# Patient Record
Sex: Female | Born: 1960
Health system: Southern US, Community
[De-identification: ages and names within clinical notes are randomized; demographics above are authoritative.]

## PROBLEM LIST (undated history)

## (undated) DIAGNOSIS — G4700 Insomnia, unspecified: Secondary | ICD-10-CM

## (undated) DIAGNOSIS — R112 Nausea with vomiting, unspecified: Secondary | ICD-10-CM

## (undated) DIAGNOSIS — M1990 Unspecified osteoarthritis, unspecified site: Secondary | ICD-10-CM

## (undated) DIAGNOSIS — R51 Headache: Secondary | ICD-10-CM

## (undated) DIAGNOSIS — R519 Headache, unspecified: Secondary | ICD-10-CM

## (undated) DIAGNOSIS — Z9889 Other specified postprocedural states: Secondary | ICD-10-CM

## (undated) DIAGNOSIS — C4491 Basal cell carcinoma of skin, unspecified: Secondary | ICD-10-CM

## (undated) DIAGNOSIS — D649 Anemia, unspecified: Secondary | ICD-10-CM

## (undated) DIAGNOSIS — S92911A Unspecified fracture of right toe(s), initial encounter for closed fracture: Secondary | ICD-10-CM

## (undated) DIAGNOSIS — B019 Varicella without complication: Secondary | ICD-10-CM

## (undated) DIAGNOSIS — N390 Urinary tract infection, site not specified: Secondary | ICD-10-CM

## (undated) HISTORY — DX: Unspecified fracture of right toe(s), initial encounter for closed fracture: S92.911A

## (undated) HISTORY — DX: Unspecified osteoarthritis, unspecified site: M19.90

## (undated) HISTORY — DX: Varicella without complication: B01.9

## (undated) HISTORY — DX: Headache, unspecified: R51.9

## (undated) HISTORY — DX: Urinary tract infection, site not specified: N39.0

## (undated) HISTORY — DX: Basal cell carcinoma of skin, unspecified: C44.91

## (undated) HISTORY — DX: Anemia, unspecified: D64.9

## (undated) HISTORY — DX: Nausea with vomiting, unspecified: R11.2

## (undated) HISTORY — DX: Insomnia, unspecified: G47.00

## (undated) HISTORY — PX: LAPAROSCOPIC ABDOMINAL EXPLORATION: SHX6249

## (undated) HISTORY — DX: Other specified postprocedural states: Z98.890

## (undated) HISTORY — DX: Nausea with vomiting, unspecified: Z98.890

## (undated) HISTORY — DX: Headache: R51

---

## 1998-01-01 ENCOUNTER — Inpatient Hospital Stay (HOSPITAL_COMMUNITY): Admission: AD | Admit: 1998-01-01 | Discharge: 1998-01-04 | Payer: Self-pay | Admitting: *Deleted

## 2000-06-16 ENCOUNTER — Other Ambulatory Visit: Admission: RE | Admit: 2000-06-16 | Discharge: 2000-06-16 | Payer: Self-pay | Admitting: *Deleted

## 2002-10-22 ENCOUNTER — Other Ambulatory Visit: Admission: RE | Admit: 2002-10-22 | Discharge: 2002-10-22 | Payer: Self-pay | Admitting: *Deleted

## 2003-10-19 HISTORY — PX: APPENDECTOMY: SHX54

## 2004-05-20 ENCOUNTER — Other Ambulatory Visit: Admission: RE | Admit: 2004-05-20 | Discharge: 2004-05-20 | Payer: Self-pay | Admitting: Obstetrics and Gynecology

## 2004-05-28 ENCOUNTER — Inpatient Hospital Stay (HOSPITAL_COMMUNITY): Admission: AD | Admit: 2004-05-28 | Discharge: 2004-05-30 | Payer: Self-pay | Admitting: Obstetrics and Gynecology

## 2005-11-08 ENCOUNTER — Other Ambulatory Visit: Admission: RE | Admit: 2005-11-08 | Discharge: 2005-11-08 | Payer: Self-pay | Admitting: Obstetrics and Gynecology

## 2010-10-18 DIAGNOSIS — S92911A Unspecified fracture of right toe(s), initial encounter for closed fracture: Secondary | ICD-10-CM

## 2010-10-18 HISTORY — DX: Unspecified fracture of right toe(s), initial encounter for closed fracture: S92.911A

## 2011-07-20 ENCOUNTER — Emergency Department (HOSPITAL_BASED_OUTPATIENT_CLINIC_OR_DEPARTMENT_OTHER)
Admission: EM | Admit: 2011-07-20 | Discharge: 2011-07-20 | Disposition: A | Discharge status: Home / Self Care | Payer: BC Managed Care – PPO | Attending: Emergency Medicine | Admitting: Emergency Medicine

## 2011-07-20 ENCOUNTER — Emergency Department (INDEPENDENT_AMBULATORY_CARE_PROVIDER_SITE_OTHER): Payer: BC Managed Care – PPO

## 2011-07-20 ENCOUNTER — Encounter: Payer: Self-pay | Admitting: *Deleted

## 2011-07-20 DIAGNOSIS — S92919A Unspecified fracture of unspecified toe(s), initial encounter for closed fracture: Secondary | ICD-10-CM

## 2011-07-20 DIAGNOSIS — T148XXA Other injury of unspecified body region, initial encounter: Secondary | ICD-10-CM

## 2011-07-20 DIAGNOSIS — W19XXXA Unspecified fall, initial encounter: Secondary | ICD-10-CM

## 2011-07-20 DIAGNOSIS — W07XXXA Fall from chair, initial encounter: Secondary | ICD-10-CM | POA: Insufficient documentation

## 2011-07-20 MED ORDER — OXYCODONE-ACETAMINOPHEN 5-325 MG PO TABS: 2.0000 | ORAL_TABLET | ORAL | Status: AC | PRN

## 2011-07-20 NOTE — ED Provider Notes (Signed)
History     CSN: [id number] Arrival date & time: 07/20/2011  4:30 PM  Chief Complaint  Patient presents with  . Toe Injury    (Consider location/radiation/quality/duration/timing/severity/associated sxs/prior treatment) HPI Comments: Pt state her right great toe is hurting:pt states that she has bruising on left ankle, but it is okay  Patient is a 50 y.o. female presenting with fall. The history is provided by the patient. No language interpreter was used.  Fall The accident occurred less than 1 hour ago. The fall occurred from a stool. She fell from a height of 3 to 5 ft. She landed on a hard floor. There was no blood loss. Point of impact: right foot. Pain location: right foot. The pain is moderate. She was ambulatory at the scene. There was no entrapment after the fall. There was no drug use involved in the accident. There was no alcohol use involved in the accident. Pertinent negatives include no vomiting, no headaches and no loss of consciousness. The symptoms are aggravated by activity.    History reviewed. No pertinent past medical history.  Past Surgical History  Procedure Date  . Appendectomy   . Cesarean section     No family history on file.  History  Substance Use Topics  . Smoking status: Never Smoker   . Smokeless tobacco: Not on file  . Alcohol Use: Yes    OB History    Grav Para Term Preterm Abortions TAB SAB Ect Mult Living                  Review of Systems  Gastrointestinal: Negative for vomiting.  Neurological: Negative for loss of consciousness and headaches.  All other systems reviewed and are negative.    Allergies  Review of patient's allergies indicates not on file.  Home Medications  No current outpatient prescriptions on file.  BP 126/62  Pulse 78  Temp(Src) 97.9 F (36.6 C) (Oral)  Resp 20  SpO2 100%  Physical Exam  Nursing note and vitals reviewed. Constitutional: She is oriented to person, place, and time. She appears  well-developed and well-nourished.  HENT:  Head: Normocephalic.  Eyes: Pupils are equal, round, and reactive to light.  Cardiovascular: Normal rate and regular rhythm.   Pulmonary/Chest: Effort normal and breath sounds normal.  Musculoskeletal:       Pt has large amount of swelling to the proximal phalanx of the right great toe  Neurological: She is alert and oriented to person, place, and time.  Skin:       Pt has abrasion to the left lower leg and bruising to the left ankle  Psychiatric: She has a normal mood and affect.    ED Course  Procedures (including critical care time)  Labs Reviewed - No data to display Dg Toe Great Right  07/20/2011  *RADIOLOGY REPORT*  Clinical Data: Right great toe pain.  Fall.  RIGHT TOE - 2+ VIEW  Comparison: None.  Findings: Comminuted fracture noted through the proximal phalanx of the right great toe with mildly displaced fracture fragments. Fracture enters the IP joint.  Mild soft tissue swelling.  No additional acute bony abnormality.  IMPRESSION: Comminuted intra-articular fracture through the proximal phalanx of the right great toe.  Original Report Authenticated By: Cyndie Chime, M.D.     No diagnosis found.    MDM  Pt splinted for comfort by staff:pt is okay to follow up with ortho        Teressa Lower, NP 07/20/11 1705

## 2011-07-20 NOTE — ED Notes (Signed)
Right great toe inj. Larey Seat off a chair. Bruising and swelling noted.

## 2011-07-20 NOTE — ED Notes (Signed)
I fit patient to post op shoe per NP order. I then fit crutches (med) and gave instructions. Patient demonstrated use correctly.

## 2011-07-20 NOTE — ED Notes (Signed)
Pt refused wheelchair, opted to ambulate out of ER on crutches.

## 2011-07-23 NOTE — ED Provider Notes (Signed)
Medical screening examination/treatment/procedure(s) were performed by non-physician practitioner and as supervising physician I was immediately available for consultation/collaboration.  Farouk Vivero, MD 07/23/11 1623 

## 2012-05-08 ENCOUNTER — Other Ambulatory Visit: Payer: Self-pay | Admitting: Obstetrics and Gynecology

## 2012-12-04 ENCOUNTER — Other Ambulatory Visit: Payer: Self-pay | Admitting: Dermatology

## 2013-06-06 ENCOUNTER — Other Ambulatory Visit: Payer: Self-pay | Admitting: Obstetrics and Gynecology

## 2014-05-01 ENCOUNTER — Other Ambulatory Visit: Payer: Self-pay | Admitting: Dermatology

## 2014-07-16 ENCOUNTER — Other Ambulatory Visit: Payer: Self-pay | Admitting: Obstetrics and Gynecology

## 2014-07-18 LAB — CYTOLOGY - PAP

## 2014-10-24 ENCOUNTER — Other Ambulatory Visit: Payer: Self-pay | Admitting: Dermatology

## 2016-05-20 ENCOUNTER — Encounter: Payer: Self-pay | Admitting: Family Medicine

## 2016-05-25 ENCOUNTER — Ambulatory Visit (INDEPENDENT_AMBULATORY_CARE_PROVIDER_SITE_OTHER): Payer: BLUE CROSS/BLUE SHIELD | Admitting: Family Medicine

## 2016-05-25 ENCOUNTER — Encounter: Payer: Self-pay | Admitting: Family Medicine

## 2016-05-25 VITALS — BP 135/82 | HR 81 | Temp 98.0°F | Resp 20 | Ht 66.0 in | Wt 170.5 lb

## 2016-05-25 DIAGNOSIS — M2550 Pain in unspecified joint: Secondary | ICD-10-CM | POA: Diagnosis not present

## 2016-05-25 DIAGNOSIS — Z7189 Other specified counseling: Secondary | ICD-10-CM

## 2016-05-25 DIAGNOSIS — Z6827 Body mass index (BMI) 27.0-27.9, adult: Secondary | ICD-10-CM

## 2016-05-25 DIAGNOSIS — W57XXXA Bitten or stung by nonvenomous insect and other nonvenomous arthropods, initial encounter: Secondary | ICD-10-CM

## 2016-05-25 DIAGNOSIS — Z803 Family history of malignant neoplasm of breast: Secondary | ICD-10-CM | POA: Diagnosis not present

## 2016-05-25 DIAGNOSIS — T148 Other injury of unspecified body region: Secondary | ICD-10-CM

## 2016-05-25 DIAGNOSIS — Z0001 Encounter for general adult medical examination with abnormal findings: Secondary | ICD-10-CM | POA: Insufficient documentation

## 2016-05-25 DIAGNOSIS — E663 Overweight: Secondary | ICD-10-CM | POA: Insufficient documentation

## 2016-05-25 DIAGNOSIS — Z1329 Encounter for screening for other suspected endocrine disorder: Secondary | ICD-10-CM

## 2016-05-25 DIAGNOSIS — Z136 Encounter for screening for cardiovascular disorders: Secondary | ICD-10-CM

## 2016-05-25 DIAGNOSIS — R7309 Other abnormal glucose: Secondary | ICD-10-CM | POA: Insufficient documentation

## 2016-05-25 DIAGNOSIS — Z7689 Persons encountering health services in other specified circumstances: Secondary | ICD-10-CM

## 2016-05-25 HISTORY — DX: Bitten or stung by nonvenomous insect and other nonvenomous arthropods, initial encounter: W57.XXXA

## 2016-05-25 NOTE — Patient Instructions (Signed)
It was a pleasure to meet you today.  Please schedule fasting labs at the 8-10 week mark after your tick bite.  We will test all screening and arthralgia labs.  We will cover physical, lab results on the provider appt to follow about 3 days after labs drawn.

## 2016-05-25 NOTE — Addendum Note (Signed)
Addended by: Howard Pouch A on: 05/25/2016 08:05 PM   Modules accepted: Orders

## 2016-05-25 NOTE — Progress Notes (Signed)
Patient ID: Michelle Ochoa, female  DOB: 1961-07-16, 55 y.o.   MRN: [id number] Patient Care Team    Relationship Specialty Notifications Start End  Ma Hillock, DO PCP - General Family Medicine  05/20/16   Dian Queen, MD Consulting Physician Obstetrics and Gynecology  05/20/16     Subjective:  Michelle Ochoa is a 55 y.o.  female present for new patient establishment With a list of complaints she would like to discuss. Patient was advised this is a new patient establishment appointment and we can cover 1 acute complaint with her establishment. She will need to follow-up on all of her other chronic issues at a separate appointment. All past medical history, surgical history, allergies, family history, immunizations, medications and social history were Obtained and entered in the electronic medical record today. All recent labs, ED visits and hospitalizations within the last year were reviewed. All records are requested/reviewed.  Tick bite: Patient reports he tick bite that occurred about 6 weeks ago, behind right knee. Since that time she feels that Her arthralgias are "amped up". She reports to take as being engorged, and she states she received the prophylactic one-time dose of doxycycline. She denies headache, fever, chills, rash. She took tylenol  last night t because of increased pain. She states typically she does not have to take medications for her arthralgia/arthritis.  Health maintenance:  Colonoscopy: Never completed, fhx polyps. Overdue. Mammogram: completed:08/2015, birads normal. Family history in her mother at age 78. Would advise yearly screening. Patient follows with gynecology. Cervical cancer screening: last pap: 08/2015, Dr.Grewal gynecologist. Immunizations: tdap unknown, Influenza unknown (encouraged yearly) Infectious disease screening: unknown. DEXA: normal, 2007.  Labs 02/12/2015: Labs for tsh, T3, T4, T4 free were all normal. Hgba1c 5.7   There is no  immunization history on file for this patient.   Past Medical History:  Diagnosis Date  . Arthritis   . Basal cell carcinoma   . Chicken pox   . Headache   . Insomnia   . Toe fracture, right 2012   rt great toe  . UTI (lower urinary tract infection)    Allergies  Allergen Reactions  . Codeine Nausea Only  . Vicodin [Hydrocodone-Acetaminophen] Nausea Only   Past Surgical History:  Procedure Laterality Date  . APPENDECTOMY  2005  . CESAREAN SECTION  [phone]   Family History  Problem Relation Age of Onset  . Breast cancer Mother     72  . Dementia Mother   . Prostate cancer Father   . Diabetes Paternal Uncle   . Heart disease Paternal Uncle   . Early death Paternal Uncle   . Depression Maternal Grandmother   . Diabetes Maternal Grandfather   . Diabetes Paternal Grandfather    Social History   Social History  . Marital status: Married    Spouse name: N/A  . Number of children: N/A  . Years of education: N/A   Occupational History  . Not on file.   Social History Main Topics  . Smoking status: Never Smoker  . Smokeless tobacco: Never Used  . Alcohol use 4.8 oz/week    8 Glasses of wine per week  . Drug use: No  . Sexual activity: Yes    Partners: Male     Comment: Married; husband has vasectomy.   Other Topics Concern  . Not on file   Social History Narrative   Married, [facility] 3 adult children [name], [place], [place]).   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    Takes daily vitamin   Wears seatbelt   exercises routinely.    Smoke detector in the home.    Feels safe in her relationships.      Medication List       Accurate as of 05/25/16  7:41 PM. Always use your most recent med list.          diphenhydrAMINE 25 mg capsule Commonly known as:  BENADRYL Take 25 mg by mouth at bedtime as needed.        No results found for this or any previous visit (from the past 2160 hour(s)).   ROS: 14 pt review  of systems performed and negative (unless mentioned in an HPI)  Objective: BP 135/82 (BP Location: Right Arm, Patient Position: Sitting, Cuff Size: Normal)   Pulse 81   Temp 98 F (36.7 C)   Resp 20   Ht _0  (1.676 m)   Wt 170 lb 8 oz (77.3 kg)   SpO2 98%   BMI 27.52 kg/m  Gen: Afebrile. No acute distress. Nontoxic in appearance, well-developed, well-nourished,  pleasant Caucasian female. HENT: AT. [place].  MMM.  Eyes:Pupils Equal Round Reactive to light, Extraocular movements intact,  Conjunctiva without redness, discharge or icterus. Neck/lymp/endocrine: Supple, no lymphadenopathy CV: RRR, no edema Chest: CTAB, no wheeze, rhonchi or crackles.  Abd: Soft. NTND. BS present. Skin:  Warm and well-perfused.  Neuro/Msk:Normal gait. PERLA. EOMi. Alert. Oriented x3.  No obvious muscle skeletal deformity or joint erythema. Psych: Normal affect, dress and demeanor. Normal speech. Normal thought content and judgment.  Assessment/plan: Michelle Ochoa is a 55 y.o. female present for establishment of care with complaints of recent tick bite and increased arthralgias. Arthralgia/tick bite - Start workup for arthralgias, rule out tickborne illness, inflammatory causes. - CBC w/Diff; Future - Comp Met (CMET); Future - TSH; Future - Sed Rate (ESR); Future - C-reactive protein; Future - Rheumatoid Factor; Future - Vitamin D (25 hydroxy); Future - B12; Future - Antinuclear Antib (ANA); Future  Family history of breast cancer Would advise yearly screening with her family history, repeat indicated at the end of this year.  Elevated hemoglobin A1c/BMI 27 - Patient will be counseled on diet and exercise as appropriate if repeat labs elevated. - Hemoglobin A1c; Future  Thyroid disorder screen - TSH; Future  Encounter for screening for cardiovascular disorders - Lipid panel; Future  Future labs Place for physical which is to occur within the next 4 weeks. Discussed with patient making a lab  appointment early so that CAN be drawn, and results will be able to be discussed during her visit.   Return in about 2 weeks (around 06/08/2016) for CPE.  Greater than 45 minutes was spent with patient, greater than 50% of that time was spent face-to-face with patient counseling and coordinating care.  Electronically signed by: Howard Pouch, DO [place]

## 2016-06-04 ENCOUNTER — Other Ambulatory Visit (INDEPENDENT_AMBULATORY_CARE_PROVIDER_SITE_OTHER): Payer: BLUE CROSS/BLUE SHIELD

## 2016-06-04 DIAGNOSIS — M2550 Pain in unspecified joint: Secondary | ICD-10-CM | POA: Diagnosis not present

## 2016-06-04 DIAGNOSIS — W57XXXA Bitten or stung by nonvenomous insect and other nonvenomous arthropods, initial encounter: Secondary | ICD-10-CM | POA: Diagnosis not present

## 2016-06-04 DIAGNOSIS — Z136 Encounter for screening for cardiovascular disorders: Secondary | ICD-10-CM | POA: Diagnosis not present

## 2016-06-04 DIAGNOSIS — T148 Other injury of unspecified body region: Secondary | ICD-10-CM | POA: Diagnosis not present

## 2016-06-04 DIAGNOSIS — R7309 Other abnormal glucose: Secondary | ICD-10-CM | POA: Diagnosis not present

## 2016-06-04 DIAGNOSIS — Z1329 Encounter for screening for other suspected endocrine disorder: Secondary | ICD-10-CM | POA: Diagnosis not present

## 2016-06-04 LAB — COMPREHENSIVE METABOLIC PANEL
ALBUMIN: 4.1 g/dL (ref 3.5–5.2)
ALK PHOS: 69 U/L (ref 39–117)
ALT: 29 U/L (ref 0–35)
AST: 27 U/L (ref 0–37)
BILIRUBIN TOTAL: 0.6 mg/dL (ref 0.2–1.2)
BUN: 8 mg/dL (ref 6–23)
CALCIUM: 9.2 mg/dL (ref 8.4–10.5)
CO2: 30 mEq/L (ref 19–32)
CREATININE: 0.79 mg/dL (ref 0.40–1.20)
Chloride: 105 mEq/L (ref 96–112)
GFR: 80.97 mL/min (ref >60.00)
Glucose, Bld: 95 mg/dL (ref 70–99)
Potassium: 4.4 mEq/L (ref 3.5–5.1)
SODIUM: 140 meq/L (ref 135–145)
TOTAL PROTEIN: 6.6 g/dL (ref 6.0–8.3)

## 2016-06-04 LAB — LIPID PANEL
CHOL/HDL RATIO: 4
Cholesterol: 212 mg/dL — ABNORMAL HIGH (ref 0–200)
HDL: 51.9 mg/dL (ref >39.00)
LDL CALC: 131 mg/dL — AB (ref 0–99)
NonHDL: 159.78
TRIGLYCERIDES: 146 mg/dL (ref 0.0–149.0)
VLDL: 29.2 mg/dL (ref 0.0–40.0)

## 2016-06-04 LAB — CBC WITH DIFFERENTIAL/PLATELET
Basophils Absolute: 0 10*3/uL (ref 0.0–0.1)
Basophils Relative: 0.6 % (ref 0.0–3.0)
EOS ABS: 0.1 10*3/uL (ref 0.0–0.7)
EOS PCT: 2.2 % (ref 0.0–5.0)
HEMATOCRIT: 38.8 % (ref 36.0–46.0)
HEMOGLOBIN: 13.3 g/dL (ref 12.0–15.0)
LYMPHS PCT: 37.3 % (ref 12.0–46.0)
Lymphs Abs: 1.7 10*3/uL (ref 0.7–4.0)
MCHC: 34.2 g/dL (ref 30.0–36.0)
MCV: 87.9 fl (ref 78.0–100.0)
MONO ABS: 0.4 10*3/uL (ref 0.1–1.0)
Monocytes Relative: 7.9 % (ref 3.0–12.0)
Neutro Abs: 2.4 10*3/uL (ref 1.4–7.7)
Neutrophils Relative %: 52 % (ref 43.0–77.0)
Platelets: 189 10*3/uL (ref 150.0–400.0)
RBC: 4.42 Mil/uL (ref 3.87–5.11)
RDW: 13.6 % (ref 11.5–15.5)
WBC: 4.6 10*3/uL (ref 4.0–10.5)

## 2016-06-04 LAB — VITAMIN B12: Vitamin B-12: 387 pg/mL (ref 211–911)

## 2016-06-04 LAB — VITAMIN D 25 HYDROXY (VIT D DEFICIENCY, FRACTURES): VITD: 19.54 ng/mL — ABNORMAL LOW (ref 30.00–100.00)

## 2016-06-04 LAB — C-REACTIVE PROTEIN: CRP: 0.1 mg/dL — ABNORMAL LOW (ref 0.5–20.0)

## 2016-06-04 LAB — SEDIMENTATION RATE: Sed Rate: 18 mm/hr (ref 0–30)

## 2016-06-04 LAB — HEMOGLOBIN A1C: HEMOGLOBIN A1C: 5.4 % (ref 4.6–6.5)

## 2016-06-04 LAB — RHEUMATOID FACTOR

## 2016-06-04 LAB — TSH: TSH: 2.11 u[IU]/mL (ref 0.35–4.50)

## 2016-06-07 LAB — ANA: ANA: POSITIVE — AB

## 2016-06-07 LAB — ANTI-NUCLEAR AB-TITER (ANA TITER): ANA Titer 1: 1:80 {titer} — ABNORMAL HIGH

## 2016-06-08 ENCOUNTER — Encounter: Payer: Self-pay | Admitting: Family Medicine

## 2016-06-08 ENCOUNTER — Ambulatory Visit (INDEPENDENT_AMBULATORY_CARE_PROVIDER_SITE_OTHER): Payer: BLUE CROSS/BLUE SHIELD | Admitting: Family Medicine

## 2016-06-08 VITALS — BP 119/79 | HR 65 | Temp 98.3°F | Resp 20 | Ht 66.0 in | Wt 171.5 lb

## 2016-06-08 DIAGNOSIS — Z6827 Body mass index (BMI) 27.0-27.9, adult: Secondary | ICD-10-CM | POA: Diagnosis not present

## 2016-06-08 DIAGNOSIS — M2550 Pain in unspecified joint: Secondary | ICD-10-CM

## 2016-06-08 DIAGNOSIS — Z0001 Encounter for general adult medical examination with abnormal findings: Secondary | ICD-10-CM | POA: Diagnosis not present

## 2016-06-08 DIAGNOSIS — E559 Vitamin D deficiency, unspecified: Secondary | ICD-10-CM

## 2016-06-08 DIAGNOSIS — Z1211 Encounter for screening for malignant neoplasm of colon: Secondary | ICD-10-CM

## 2016-06-08 DIAGNOSIS — Z23 Encounter for immunization: Secondary | ICD-10-CM | POA: Diagnosis not present

## 2016-06-08 MED ORDER — VITAMIN D (ERGOCALCIFEROL) 1.25 MG (50000 UNIT) PO CAPS: 50000.0000 [IU] | ORAL_CAPSULE | ORAL | 0 refills | Status: DC

## 2016-06-08 NOTE — Patient Instructions (Addendum)
Health Maintenance, Female Adopting a healthy lifestyle and getting preventive care can go a long way to promote health and wellness. Talk with your health care provider about what schedule of regular examinations is right for you. This is a good chance for you to check in with your provider about disease prevention and staying healthy. In between checkups, there are plenty of things you can do on your own. Experts have done a lot of research about which lifestyle changes and preventive measures are most likely to keep you healthy. Ask your health care provider for more information. WEIGHT AND DIET  Eat a healthy diet  Be sure to include plenty of vegetables, fruits, low-fat dairy products, and lean protein.  Do not eat a lot of foods high in solid fats, added sugars, or salt.  Get regular exercise. This is one of the most important things you can do for your health.  Most adults should exercise for at least 150 minutes each week. The exercise should increase your heart rate and make you sweat (moderate-intensity exercise).  Most adults should also do strengthening exercises at least twice a week. This is in addition to the moderate-intensity exercise.  Maintain a healthy weight  Body mass index (BMI) is a measurement that can be used to identify possible weight problems. It estimates body fat based on height and weight. Your health care provider can help determine your BMI and help you achieve or maintain a healthy weight.  For females 20 years of age and older:   A BMI below 18.5 is considered underweight.  A BMI of 18.5 to 24.9 is normal.  A BMI of 25 to 29.9 is considered overweight.  A BMI of 30 and above is considered obese.  Watch levels of cholesterol and blood lipids  You should start having your blood tested for lipids and cholesterol at 53 years of age, then have this test every 5 years.  You may need to have your cholesterol levels checked more often if:  Your lipid  or cholesterol levels are high.  You are older than 53 years of age.  You are at high risk for heart disease.  CANCER SCREENING   Lung Cancer  Lung cancer screening is recommended for adults 55-80 years old who are at high risk for lung cancer because of a history of smoking.  A yearly low-dose CT scan of the lungs is recommended for people who:  Currently smoke.  Have quit within the past 15 years.  Have at least a 30-pack-year history of smoking. A pack year is smoking an average of one pack of cigarettes a day for 1 year.  Yearly screening should continue until it has been 15 years since you quit.  Yearly screening should stop if you develop a health problem that would prevent you from having lung cancer treatment.  Breast Cancer  Practice breast self-awareness. This means understanding how your breasts normally appear and feel.  It also means doing regular breast self-exams. Let your health care provider know about any changes, no matter how small.  If you are in your 20s or 30s, you should have a clinical breast exam (CBE) by a health care provider every 1-3 years as part of a regular health exam.  If you are 40 or older, have a CBE every year. Also consider having a breast X-ray (mammogram) every year.  If you have a family history of breast cancer, talk to your health care provider about genetic screening.  If you   are at high risk for breast cancer, talk to your health care provider about having an MRI and a mammogram every year.  Breast cancer gene (BRCA) assessment is recommended for women who have family members with BRCA-related cancers. BRCA-related cancers include:  Breast.  Ovarian.  Tubal.  Peritoneal cancers.  Results of the assessment will determine the need for genetic counseling and BRCA1 and BRCA2 testing. Cervical Cancer Your health care provider may recommend that you be screened regularly for cancer of the pelvic organs (ovaries, uterus, and  vagina). This screening involves a pelvic examination, including checking for microscopic changes to the surface of your cervix (Pap test). You may be encouraged to have this screening done every 3 years, beginning at age 21.  For women ages 30-65, health care providers may recommend pelvic exams and Pap testing every 3 years, or they may recommend the Pap and pelvic exam, combined with testing for human papilloma virus (HPV), every 5 years. Some types of HPV increase your risk of cervical cancer. Testing for HPV may also be done on women of any age with unclear Pap test results.  Other health care providers may not recommend any screening for nonpregnant women who are considered low risk for pelvic cancer and who do not have symptoms. Ask your health care provider if a screening pelvic exam is right for you.  If you have had past treatment for cervical cancer or a condition that could lead to cancer, you need Pap tests and screening for cancer for at least 20 years after your treatment. If Pap tests have been discontinued, your risk factors (such as having a new sexual partner) need to be reassessed to determine if screening should resume. Some women have medical problems that increase the chance of getting cervical cancer. In these cases, your health care provider may recommend more frequent screening and Pap tests. Colorectal Cancer  This type of cancer can be detected and often prevented.  Routine colorectal cancer screening usually begins at 53 years of age and continues through 53 years of age.  Your health care provider may recommend screening at an earlier age if you have risk factors for colon cancer.  Your health care provider may also recommend using home test kits to check for hidden blood in the stool.  A small camera at the end of a tube can be used to examine your colon directly (sigmoidoscopy or colonoscopy). This is done to check for the earliest forms of colorectal  cancer.  Routine screening usually begins at age 50.  Direct examination of the colon should be repeated every 5-10 years through 53 years of age. However, you may need to be screened more often if early forms of precancerous polyps or small growths are found. Skin Cancer  Check your skin from head to toe regularly.  Tell your health care provider about any new moles or changes in moles, especially if there is a change in a mole's shape or color.  Also tell your health care provider if you have a mole that is larger than the size of a pencil eraser.  Always use sunscreen. Apply sunscreen liberally and repeatedly throughout the day.  Protect yourself by wearing long sleeves, pants, a wide-brimmed hat, and sunglasses whenever you are outside. HEART DISEASE, DIABETES, AND HIGH BLOOD PRESSURE   High blood pressure causes heart disease and increases the risk of stroke. High blood pressure is more likely to develop in:  People who have blood pressure in the high end   of the normal range (130-139/85-89 mm Hg).  People who are overweight or obese.  People who are African American.  If you are 38-23 years of age, have your blood pressure checked every 3-5 years. If you are 61 years of age or older, have your blood pressure checked every year. You should have your blood pressure measured twice--once when you are at a hospital or clinic, and once when you are not at a hospital or clinic. Record the average of the two measurements. To check your blood pressure when you are not at a hospital or clinic, you can use:  An automated blood pressure machine at a pharmacy.  A home blood pressure monitor.  If you are between 45 years and 39 years old, ask your health care provider if you should take aspirin to prevent strokes.  Have regular diabetes screenings. This involves taking a blood sample to check your fasting blood sugar level.  If you are at a normal weight and have a low risk for diabetes,  have this test once every three years after 53 years of age.  If you are overweight and have a high risk for diabetes, consider being tested at a younger age or more often. PREVENTING INFECTION  Hepatitis B  If you have a higher risk for hepatitis B, you should be screened for this virus. You are considered at high risk for hepatitis B if:  You were born in a country where hepatitis B is common. Ask your health care provider which countries are considered high risk.  Your parents were born in a high-risk country, and you have not been immunized against hepatitis B (hepatitis B vaccine).  You have HIV or AIDS.  You use needles to inject street drugs.  You live with someone who has hepatitis B.  You have had sex with someone who has hepatitis B.  You get hemodialysis treatment.  You take certain medicines for conditions, including cancer, organ transplantation, and autoimmune conditions. Hepatitis C  Blood testing is recommended for:  Everyone born from [birth year] through 1963.  Anyone with known risk factors for hepatitis C. Sexually transmitted infections (STIs)  You should be screened for sexually transmitted infections (STIs) including gonorrhea and chlamydia if:  You are sexually active and are younger than 53 years of age.  You are older than 53 years of age and your health care provider tells you that you are at risk for this type of infection.  Your sexual activity has changed since you were last screened and you are at an increased risk for chlamydia or gonorrhea. Ask your health care provider if you are at risk.  If you do not have HIV, but are at risk, it may be recommended that you take a prescription medicine daily to prevent HIV infection. This is called pre-exposure prophylaxis (PrEP). You are considered at risk if:  You are sexually active and do not regularly use condoms or know the HIV status of your partner(s).  You take drugs by injection.  You are sexually  active with a partner who has HIV. Talk with your health care provider about whether you are at high risk of being infected with HIV. If you choose to begin PrEP, you should first be tested for HIV. You should then be tested every 3 months for as long as you are taking PrEP.  PREGNANCY   If you are premenopausal and you may become pregnant, ask your health care provider about preconception counseling.  If you may  become pregnant, take 400 to 800 micrograms (mcg) of folic acid every day.  If you want to prevent pregnancy, talk to your health care provider about birth control (contraception). OSTEOPOROSIS AND MENOPAUSE   Osteoporosis is a disease in which the bones lose minerals and strength with aging. This can result in serious bone fractures. Your risk for osteoporosis can be identified using a bone density scan.  If you are 5 years of age or older, or if you are at risk for osteoporosis and fractures, ask your health care provider if you should be screened.  Ask your health care provider whether you should take a calcium or vitamin D supplement to lower your risk for osteoporosis.  Menopause may have certain physical symptoms and risks.  Hormone replacement therapy may reduce some of these symptoms and risks. Talk to your health care provider about whether hormone replacement therapy is right for you.  HOME CARE INSTRUCTIONS   Schedule regular health, dental, and eye exams.  Stay current with your immunizations.   Do not use any tobacco products including cigarettes, chewing tobacco, or electronic cigarettes.  If you are pregnant, do not drink alcohol.  If you are breastfeeding, limit how much and how often you drink alcohol.  Limit alcohol intake to no more than 1 drink per day for nonpregnant women. One drink equals 12 ounces of beer, 5 ounces of wine, or 1 ounces of hard liquor.  Do not use street drugs.  Do not share needles.  Ask your health care provider for help if  you need support or information about quitting drugs.  Tell your health care provider if you often feel depressed.  Tell your health care provider if you have ever been abused or do not feel safe at home.   This information is not intended to replace advice given to you by your health care provider. Make sure you discuss any questions you have with your health care provider.   Document Released: 04/19/2011 Document Revised: 10/25/2014 Document Reviewed: 09/05/2013 Elsevier Interactive Patient Education 2016 [place] d called in , retest in 9 weeks. Lab appt only.  Start OTC b12 supplement.  Refer to GI to have colonoscopy.

## 2016-06-08 NOTE — Progress Notes (Signed)
Patient ID: Michelle Ochoa, female  DOB: [birth year], 55 y.o.   MRN: [id number] Patient Care Team    Relationship Specialty Notifications Start End  Ma Hillock, DO PCP - General Family Medicine  05/20/16   Dian Queen, MD Consulting Physician Obstetrics and Gynecology  05/20/16     Subjective:  Michelle Ochoa is a 55 y.o.  female present for CPE. All labs collected for CPE and arthralgia visit were reviewed with pt today.   Health maintenance:  Colonoscopy: Never completed, fhx polyps. Overdue. Mammogram: completed:08/2015, birads normal. Family history in her mother at age 41. Would advise yearly screening. Patient follows with gynecology. Cervical cancer screening: last pap: 08/2015, Dr.Grewal gynecologist. Immunizations: tdap adminstered, Influenza unknown (encouraged yearly) Infectious disease screening: unknown.  DEXA: normal, 2007.  Immunization History  Administered Date(s) Administered  . Tdap 06/08/2016     Past Medical History:  Diagnosis Date  . Arthritis   . Basal cell carcinoma   . Chicken pox   . Headache   . Insomnia   . Toe fracture, right 2012   rt great toe  . UTI (lower urinary tract infection)    Allergies  Allergen Reactions  . Codeine Nausea Only  . Vicodin [Hydrocodone-Acetaminophen] Nausea Only   Past Surgical History:  Procedure Laterality Date  . APPENDECTOMY  2005  . CESAREAN SECTION  [phone]   Family History  Problem Relation Age of Onset  . Breast cancer Mother     41  . Dementia Mother   . Prostate cancer Father   . Diabetes Paternal Uncle   . Heart disease Paternal Uncle   . Early death Paternal Uncle   . Depression Maternal Grandmother   . Diabetes Maternal Grandfather   . Diabetes Paternal Grandfather    Social History   Social History  . Marital status: Married    Spouse name: N/A  . Number of children: N/A  . Years of education: N/A   Occupational History  . Not on file.   Social History Main  Topics  . Smoking status: Never Smoker  . Smokeless tobacco: Never Used  . Alcohol use 4.8 oz/week    8 Glasses of wine per week  . Drug use: No  . Sexual activity: Yes    Partners: Male     Comment: Married; husband has vasectomy.   Other Topics Concern  . Not on file   Social History Narrative   Married, [facility] 3 adult children [name], [place], [place]).   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    Takes daily vitamin   Wears seatbelt   exercises routinely.    Smoke detector in the home.    Feels safe in her relationships.      Medication List       Accurate as of 06/08/16 12:34 PM. Always use your most recent med list.          diphenhydrAMINE 25 mg capsule Commonly known as:  BENADRYL Take 25 mg by mouth at bedtime as needed.   fluorouracil 5 % cream Commonly known as:  EFUDEX   Vitamin D (Ergocalciferol) 50000 units Caps capsule Commonly known as:  DRISDOL Take 1 capsule (50,000 Units total) by mouth every 7 (seven) days.        Recent Results (from the past 2160 hour(s))  CBC w/Diff     Status: None   Collection Time: 06/04/16  8:22 AM  Result Value Ref Range  WBC 4.6 4.0 - 10.5 K/uL   RBC 4.42 3.87 - 5.11 Mil/uL   Hemoglobin 13.3 12.0 - 15.0 g/dL   HCT 38.8 36.0 - 46.0 %   MCV 87.9 78.0 - 100.0 fl   MCHC 34.2 30.0 - 36.0 g/dL   RDW 13.6 11.5 - 15.5 %   Platelets 189.0 150.0 - 400.0 K/uL   Neutrophils Relative % 52.0 43.0 - 77.0 %   Lymphocytes Relative 37.3 12.0 - 46.0 %   Monocytes Relative 7.9 3.0 - 12.0 %   Eosinophils Relative 2.2 0.0 - 5.0 %   Basophils Relative 0.6 0.0 - 3.0 %   Neutro Abs 2.4 1.4 - 7.7 K/uL   Lymphs Abs 1.7 0.7 - 4.0 K/uL   Monocytes Absolute 0.4 0.1 - 1.0 K/uL   Eosinophils Absolute 0.1 0.0 - 0.7 K/uL   Basophils Absolute 0.0 0.0 - 0.1 K/uL  Comp Met (CMET)     Status: None   Collection Time: 06/04/16  8:22 AM  Result Value Ref Range   Sodium 140 135 - 145 mEq/L   Potassium 4.4 3.5  - 5.1 mEq/L   Chloride 105 96 - 112 mEq/L   CO2 30 19 - 32 mEq/L   Glucose, Bld 95 70 - 99 mg/dL   BUN 8 6 - 23 mg/dL   Creatinine, Ser 0.79 0.40 - 1.20 mg/dL   Total Bilirubin 0.6 0.2 - 1.2 mg/dL   Alkaline Phosphatase 69 39 - 117 U/L   AST 27 0 - 37 U/L   ALT 29 0 - 35 U/L   Total Protein 6.6 6.0 - 8.3 g/dL   Albumin 4.1 3.5 - 5.2 g/dL   Calcium 9.2 8.4 - 10.5 mg/dL   GFR 80.97 >60.00 mL/min  Hemoglobin A1c     Status: None   Collection Time: 06/04/16  8:22 AM  Result Value Ref Range   Hgb A1c MFr Bld 5.4 4.6 - 6.5 %    Comment: Glycemic Control Guidelines for People with Diabetes:Non Diabetic:  <6%Goal of Therapy: <7%Additional Action Suggested:  >8%   TSH     Status: None   Collection Time: 06/04/16  8:22 AM  Result Value Ref Range   TSH 2.11 0.35 - 4.50 uIU/mL  Sed Rate (ESR)     Status: None   Collection Time: 06/04/16  8:22 AM  Result Value Ref Range   Sed Rate 18 0 - 30 mm/hr  C-reactive protein     Status: Abnormal   Collection Time: 06/04/16  8:22 AM  Result Value Ref Range   CRP 0.1 (L) 0.5 - 20.0 mg/dL  Rheumatoid Factor     Status: None   Collection Time: 06/04/16  8:22 AM  Result Value Ref Range   Rhuematoid fact SerPl-aCnc <10 <=14 IU/mL    Comment:                            Interpretive Table                     Low Positive: 15 - 41 IU/mL                     High Positive:  >= 42 IU/mL    In addition to the RF result, and clinical symptoms including joint  involvement, the 2010 ACR Classification Criteria for  scoring/diagnosing Rheumatoid Arthritis include the results of the  following tests:  CRP (78588), ESR (  15010), and CCP (APCA) (33354).  www.rheumatology.org/practice/clinical/classification/ra/ra_2010.asp   Vitamin D (25 hydroxy)     Status: Abnormal   Collection Time: 06/04/16  8:22 AM  Result Value Ref Range   VITD 19.54 (L) 30.00 - 100.00 ng/mL  Lipid panel     Status: Abnormal   Collection Time: 06/04/16  8:22 AM  Result Value Ref Range    Cholesterol 212 (H) 0 - 200 mg/dL    Comment: ATP III Classification       Desirable:  < 200 mg/dL               Borderline High:  200 - 239 mg/dL          High:  > = 240 mg/dL   Triglycerides 146.0 0.0 - 149.0 mg/dL    Comment: Normal:  <150 mg/dLBorderline High:  150 - 199 mg/dL   HDL 51.90 >39.00 mg/dL   VLDL 29.2 0.0 - 40.0 mg/dL   LDL Cholesterol 131 (H) 0 - 99 mg/dL   Total CHOL/HDL Ratio 4     Comment:                Men          Women1/2 Average Risk     3.4          3.3Average Risk          5.0          4.42X Average Risk          9.6          7.13X Average Risk          15.0          11.0                       NonHDL 159.78     Comment: NOTE:  Non-HDL goal should be 30 mg/dL higher than patient's LDL goal (i.e. LDL goal of < 70 mg/dL, would have non-HDL goal of < 100 mg/dL)  B12     Status: None   Collection Time: 06/04/16  8:22 AM  Result Value Ref Range   Vitamin B-12 387 211 - 911 pg/mL  Antinuclear Antib (ANA)     Status: Abnormal   Collection Time: 06/04/16  8:22 AM  Result Value Ref Range   Anit Nuclear Antibody(ANA) POS (A) NEGATIVE  Rocky mtn spotted fvr abs pnl(IgG+IgM)     Status: None (Preliminary result)   Collection Time: 06/04/16  8:22 AM  Result Value Ref Range   RMSF IgG  NOT DETECTED   RMSF IgM  NOT DETECTED    Comment: Cross-reactivity between the Spotted Fever and Typhus groups is minor. Significant cross reactivity within the Spotted Fever or Typhus group precludes speciation of the rickettsiae.   Lyme Aby, Western Blot IgG & IgM w/bands     Status: None (Preliminary result)   Collection Time: 06/04/16  8:22 AM  Result Value Ref Range   B burgdorferi IgG Abs (IB)     Lyme Disease 18 kD IgG     Lyme Disease 23 kD IgG     Lyme Disease 28 kD IgG     Lyme Disease 30 kD IgG     Lyme Disease 39 kD IgG     Lyme Disease 41 kD IgG     Lyme Disease 45 kD IgG     Lyme Disease 58 kD IgG     Lyme Disease 66 kD IgG  Lyme Disease 93 kD IgG     B  burgdorferi IgM Abs (IB)     Lyme Disease 23 kD IgM     Lyme Disease 39 kD IgM     Lyme Disease 41 kD IgM      Comment: A positive IgM test result alone is not recommended for use in determining active disease due to a high likelihood of a false-positive result in persons with illness greater than 1 month duration. The CDC recommends testing by the EIA antibody test, followed by the Immunoblot confirmatory test only when the EIA test is positive.   Anti-nuclear ab-titer (ANA titer)     Status: Abnormal   Collection Time: 06/04/16  8:22 AM  Result Value Ref Range   ANA Pattern 1 HOMOGENEOUS (A)    ANA Titer 1 1:80 (H) titer    Comment:           Reference Range           < 1:40      Negative             1:40-1:80 Low Antibody level           > 1:80      Elevated Antibody level      ROS: 14 pt review of systems performed and negative (unless mentioned in an HPI)  Objective: BP 119/79 (BP Location: Left Arm, Patient Position: Sitting, Cuff Size: Normal)   Pulse 65   Temp 98.3 F (36.8 C)   Resp 20   Ht 5' 6"  (1.676 m)   Wt 171 lb 8 oz (77.8 kg)   SpO2 97%   BMI 27.68 kg/m  Gen: Afebrile. No acute distress. Nontoxic in appearance, well-developed, well-nourished,  pleasant Caucasian female. HENT: AT. [place].  MMM.  Eyes:Pupils Equal Round Reactive to light, Extraocular movements intact,  Conjunctiva without redness, discharge or icterus. Neck/lymp/endocrine: Supple, no lymphadenopathy CV: RRR, no edema Chest: CTAB, no wheeze, rhonchi or crackles.  Abd: Soft. NTND. BS present. Skin:  Warm and well-perfused.  Neuro/Msk:Normal gait. PERLA. EOMi. Alert. Oriented x3.  No obvious muscle skeletal deformity or joint erythema. Psych: Normal affect, dress and demeanor. Normal speech. Normal thought content and judgment.  Assessment/plan: Michelle Ochoa is a 55 y.o. female present for CPE without PAP.  Encounter for general adult medical examination with abnormal findings Pt was  encouraged to consume fresh vegetables /fruits, low saturated fat meats and exercise greater than 150 minutes a week.  Colonoscopy: Never completed, fhx polyps. Overdue. Referral placed today. Mammogram: completed:08/2015, birads normal. Family history in her mother at age 42. Would advise yearly screening. Patient follows with gynecology. Cervical cancer screening: last pap: 08/2015, Dr.Grewal gynecologist. Immunizations: tdap adminstered, Influenza unknown (encouraged yearly) Infectious disease screening: unknown.  DEXA: normal, 2007.  BMI 27.0-27.9,adult - diet and exercise counseling provided. Pt is going to nutritionist with her husband.   Vitamin D deficiency/Arhtralgia - arthralgia possibly due to low D (19).  - 50, 000u weekly for 8 weeks, future order for retest in 9 weeks. Lab appt only.  - B12 low normal, encouraged daily vitamin.  - Lyme and RMSF titers still pending.  - ANA low positive/homogonous.   Need for diphtheria-tetanus-pertussis (Tdap) vaccine - Tdap vaccine greater than or equal to 7yo IM  Colon cancer screening - Ambulatory referral to Gastroenterology, no fhx   Return in about 1 year (around 06/08/2017) for CPE.   Electronically signed by: Howard Pouch, DO [place]

## 2016-06-09 LAB — ROCKY MTN SPOTTED FVR ABS PNL(IGG+IGM)
RMSF IGG: NOT DETECTED
RMSF IgM: NOT DETECTED

## 2016-06-10 ENCOUNTER — Encounter: Payer: Self-pay | Admitting: Gastroenterology

## 2016-06-11 ENCOUNTER — Telehealth: Payer: Self-pay | Admitting: Family Medicine

## 2016-06-11 LAB — LYME ABY, WSTRN BLT IGG & IGM W/BANDS
B burgdorferi IgG Abs (IB): NEGATIVE
B burgdorferi IgM Abs (IB): NEGATIVE
LYME DISEASE 23 KD IGM: NONREACTIVE
LYME DISEASE 28 KD IGG: NONREACTIVE
LYME DISEASE 39 KD IGM: NONREACTIVE
LYME DISEASE 41 KD IGM: NONREACTIVE
LYME DISEASE 45 KD IGG: NONREACTIVE
LYME DISEASE 93 KD IGG: NONREACTIVE
Lyme Disease 18 kD IgG: NONREACTIVE
Lyme Disease 23 kD IgG: NONREACTIVE
Lyme Disease 30 kD IgG: NONREACTIVE
Lyme Disease 39 kD IgG: NONREACTIVE
Lyme Disease 41 kD IgG: NONREACTIVE
Lyme Disease 58 kD IgG: NONREACTIVE
Lyme Disease 66 kD IgG: NONREACTIVE

## 2016-06-11 NOTE — Telephone Encounter (Signed)
Spoke with patient reviewed lab results. 

## 2016-06-11 NOTE — Telephone Encounter (Signed)
Please call pt:  - Her lyme and RMSF tests are negative.

## 2016-07-01 ENCOUNTER — Ambulatory Visit (AMBULATORY_SURGERY_CENTER): Payer: Self-pay | Admitting: *Deleted

## 2016-07-01 VITALS — Ht 66.0 in | Wt 170.0 lb

## 2016-07-01 DIAGNOSIS — Z1211 Encounter for screening for malignant neoplasm of colon: Secondary | ICD-10-CM

## 2016-07-01 MED ORDER — NA SULFATE-K SULFATE-MG SULF 17.5-3.13-1.6 GM/177ML PO SOLN: 1.0000 | Freq: Once | ORAL | 0 refills | Status: AC

## 2016-07-01 NOTE — Progress Notes (Signed)
No egg or soy allergy. No anesthesia problems.  No home O2.  Pt told to hold tenuate for 10 days prior to procedure. Diet med.

## 2016-07-01 NOTE — Progress Notes (Deleted)
No egg or soy allergy. No anesthesia problems.  No home O2.  No diet meds.  

## 2016-07-02 ENCOUNTER — Encounter: Payer: Self-pay | Admitting: Gastroenterology

## 2016-07-15 ENCOUNTER — Encounter: Payer: BLUE CROSS/BLUE SHIELD | Admitting: Gastroenterology

## 2016-08-30 ENCOUNTER — Telehealth: Payer: Self-pay | Admitting: Gastroenterology

## 2016-08-30 ENCOUNTER — Encounter: Payer: BLUE CROSS/BLUE SHIELD | Admitting: Gastroenterology

## 2016-08-30 NOTE — Telephone Encounter (Signed)
Pt had called over the weekend and left message that she had not stopped her tenuate and was wanting to know if she could still have the procedure even though she had taken it. When pt called back they had already cancelled her procedure. Pt was confused why she received a call this morning stating she had missed her appt when it was cancelled over the weekend. Pt does not want to reschedule at this time because her life is busy, she will call back after the first of the year and schedule.-adm

## 2016-11-09 DIAGNOSIS — H40023 Open angle with borderline findings, high risk, bilateral: Secondary | ICD-10-CM | POA: Diagnosis not present

## 2016-11-25 DIAGNOSIS — Z83511 Family history of glaucoma: Secondary | ICD-10-CM | POA: Diagnosis not present

## 2016-11-25 DIAGNOSIS — H40023 Open angle with borderline findings, high risk, bilateral: Secondary | ICD-10-CM | POA: Diagnosis not present

## 2016-11-25 DIAGNOSIS — H40053 Ocular hypertension, bilateral: Secondary | ICD-10-CM | POA: Diagnosis not present

## 2017-01-12 ENCOUNTER — Ambulatory Visit: Payer: BLUE CROSS/BLUE SHIELD | Admitting: Family Medicine

## 2017-01-17 ENCOUNTER — Encounter: Payer: Self-pay | Admitting: Family Medicine

## 2017-01-17 ENCOUNTER — Telehealth: Payer: Self-pay | Admitting: Family Medicine

## 2017-01-17 ENCOUNTER — Ambulatory Visit (INDEPENDENT_AMBULATORY_CARE_PROVIDER_SITE_OTHER): Payer: BLUE CROSS/BLUE SHIELD | Admitting: Family Medicine

## 2017-01-17 VITALS — BP 114/74 | HR 64 | Temp 97.7°F | Resp 20 | Wt 168.2 lb

## 2017-01-17 DIAGNOSIS — M2550 Pain in unspecified joint: Secondary | ICD-10-CM

## 2017-01-17 DIAGNOSIS — E538 Deficiency of other specified B group vitamins: Secondary | ICD-10-CM | POA: Diagnosis not present

## 2017-01-17 DIAGNOSIS — E559 Vitamin D deficiency, unspecified: Secondary | ICD-10-CM

## 2017-01-17 LAB — VITAMIN B12: VITAMIN B 12: 506 pg/mL (ref 211–911)

## 2017-01-17 LAB — VITAMIN D 25 HYDROXY (VIT D DEFICIENCY, FRACTURES): VITD: 27.35 ng/mL — ABNORMAL LOW (ref 30.00–100.00)

## 2017-01-17 NOTE — Progress Notes (Signed)
Michelle Ochoa , 1961/04/12, 56 y.o., female MRN: [id number] Patient Care Team    Relationship Specialty Notifications Start End  Ma Hillock, DO PCP - General Family Medicine  05/20/16   Dian Queen, MD Consulting Physician Obstetrics and Gynecology  05/20/16     CC: Vit d deficiency.  Subjective: Pt presents for an OV for follow up on Vit D and B12 levels. She finished her vit d prescription about 2 months a go and intermittently has taken 2000 u daily. She also has taken the B12 supplement daily. She does feel improvement in both her fatigue and arthralgias since medication.    Depression screen PHQ 2/9 05/25/2016  Decreased Interest 0  Down, Depressed, Hopeless 0  PHQ - 2 Score 0    Allergies  Allergen Reactions  . Codeine Nausea Only  . Vicodin [Hydrocodone-Acetaminophen] Nausea Only   Social History  Substance Use Topics  . Smoking status: Never Smoker  . Smokeless tobacco: Never Used  . Alcohol use 4.8 oz/week    8 Glasses of wine per week   Past Medical History:  Diagnosis Date  . Arthritis   . Basal cell carcinoma   . Chicken pox   . Headache   . Insomnia   . Toe fracture, right 2012   rt great toe  . UTI (lower urinary tract infection)    Past Surgical History:  Procedure Laterality Date  . APPENDECTOMY  2005  . CESAREAN SECTION  [phone]   Family History  Problem Relation Age of Onset  . Breast cancer Mother     49  . Dementia Mother   . Prostate cancer Father   . Diabetes Paternal Uncle   . Heart disease Paternal Uncle   . Early death Paternal Uncle   . Depression Maternal Grandmother   . Diabetes Maternal Grandfather   . Diabetes Paternal Grandfather    Allergies as of 01/17/2017      Reactions   Codeine Nausea Only   Vicodin [hydrocodone-acetaminophen] Nausea Only      Medication List       Accurate as of 01/17/17  2:01 PM. Always use your most recent med list.          b complex vitamins tablet Take 1 tablet by mouth  daily.   Vitamin D3 1000 units Caps Take 2,000 Units by mouth.       No results found for this or any previous visit (from the past 24 hour(s)). No results found.   ROS: Negative, with the exception of above mentioned in HPI   Objective:  BP 114/74 (BP Location: Left Arm, Patient Position: Sitting, Cuff Size: Normal)   Pulse 64   Temp 97.7 F (36.5 C)   Resp 20   Wt 168 lb 4 oz (76.3 kg)   SpO2 97%   BMI 27.16 kg/m  Body mass index is 27.16 kg/m. Gen: Afebrile. No acute distress. Nontoxic in appearance, well developed, well nourished.  HENT: AT. [place] Kitchen MMM Eyes:Pupils Equal Round Reactive to light, Extraocular movements intact,  Conjunctiva without redness, discharge or icterus. CV: RRR  Chest: CTAB, no wheeze or crackles.  Psych: Normal affect, dress and demeanor. Normal speech. Normal thought content and judgment.  Assessment/Plan: Michelle Ochoa is a 57 y.o. female present for OV for  Vitamin D deficiency/Arthralgia, unspecified joint - VITAMIN D 25 Hydroxy (Vit-D Deficiency, Fractures) - continue 2000 u daily with a meal.  - will prescribed 50000u if needed.  B12 deficiency -  b complex vitamins tablet; Take 1 tablet by mouth daily. - B12 F/U dependent on lab levels.   Reviewed expectations re: course of current medical issues.  Discussed self-management of symptoms.  Outlined signs and symptoms indicating need for more acute intervention.  Patient verbalized understanding and all questions were answered.  Patient received an After-Visit Summary.   electronically signed by:  Howard Pouch, DO  [place]

## 2017-01-17 NOTE — Patient Instructions (Signed)
It was great to see you today.  I will call you with results once they are both back.    Weight gain: try myfitnesspal, calorie calculator, exercise > 150 min. A week of cardio.      Vitamin D Deficiency Vitamin D deficiency is when your body does not have enough vitamin D. Vitamin D is important because:  It helps your body use other minerals that your body needs.  It helps keep your bones strong and healthy.  It may help to prevent some diseases.  It helps your heart and other muscles work well. You can get vitamin D by:  Eating foods with vitamin D in them.  Drinking or eating milk or other foods that have had vitamin D added to them.  Taking a vitamin D supplement.  Being in the sun. Not getting enough vitamin D can make your bones become soft. It can also cause other health problems. Follow these instructions at home:  Take medicines and supplements only as told by your doctor.  Eat foods that have vitamin D. These include:  Dairy products, cereals, or juices with added vitamin D. Check the label for vitamin D.  Fatty fish like salmon or trout.  Eggs.  Oysters.  Do not use tanning beds.  Stay at a healthy weight. Lose weight, if needed.  Keep all follow-up visits as told by your doctor. This is important. Contact a doctor if:  Your symptoms do not go away.  You feel sick to your stomach (nauseous).  Youthrow up (vomit).  You poop less often than usual or you have trouble pooping (constipation). This information is not intended to replace advice given to you by your health care provider. Make sure you discuss any questions you have with your health care provider. Document Released: 09/23/2011 Document Revised: 03/11/2016 Document Reviewed: 02/19/2015 Elsevier Interactive Patient Education  2017 [employer].

## 2017-01-17 NOTE — Telephone Encounter (Signed)
pts vit d is just mildly low 27, I would encourage her to take the 2000 u daily as we discussed. Once she starts it routinely it should bring her Vit D to normal.  Her b12 is normal 506. I would continue the daily supplement to keep levels sustained.

## 2017-01-18 NOTE — Telephone Encounter (Signed)
Patient notified and verbalized understanding. 

## 2017-03-10 DIAGNOSIS — Z01419 Encounter for gynecological examination (general) (routine) without abnormal findings: Secondary | ICD-10-CM | POA: Diagnosis not present

## 2017-03-10 DIAGNOSIS — Z6827 Body mass index (BMI) 27.0-27.9, adult: Secondary | ICD-10-CM | POA: Diagnosis not present

## 2017-03-10 DIAGNOSIS — Z1231 Encounter for screening mammogram for malignant neoplasm of breast: Secondary | ICD-10-CM | POA: Diagnosis not present

## 2017-03-10 DIAGNOSIS — E663 Overweight: Secondary | ICD-10-CM | POA: Diagnosis not present

## 2017-07-29 DIAGNOSIS — H40023 Open angle with borderline findings, high risk, bilateral: Secondary | ICD-10-CM | POA: Diagnosis not present

## 2017-08-22 ENCOUNTER — Encounter: Payer: Self-pay | Admitting: Gastroenterology

## 2017-09-06 DIAGNOSIS — Z23 Encounter for immunization: Secondary | ICD-10-CM | POA: Diagnosis not present

## 2017-09-06 DIAGNOSIS — L570 Actinic keratosis: Secondary | ICD-10-CM | POA: Diagnosis not present

## 2017-09-06 DIAGNOSIS — L821 Other seborrheic keratosis: Secondary | ICD-10-CM | POA: Diagnosis not present

## 2017-09-06 DIAGNOSIS — L700 Acne vulgaris: Secondary | ICD-10-CM | POA: Diagnosis not present

## 2017-10-07 ENCOUNTER — Ambulatory Visit (AMBULATORY_SURGERY_CENTER): Payer: Self-pay | Admitting: *Deleted

## 2017-10-07 ENCOUNTER — Other Ambulatory Visit: Payer: Self-pay

## 2017-10-07 VITALS — Ht 66.0 in | Wt 166.0 lb

## 2017-10-07 DIAGNOSIS — Z1211 Encounter for screening for malignant neoplasm of colon: Secondary | ICD-10-CM

## 2017-10-07 MED ORDER — PEG-KCL-NACL-NASULF-NA ASC-C 140 G PO SOLR: 1.0000 | ORAL | 0 refills | Status: DC

## 2017-10-07 NOTE — Progress Notes (Signed)
No egg or soy allergy known to patient  No issues with past sedation with any surgeries  or procedures, no intubation problems  No diet pills per patient No home 02 use per patient  No blood thinners per patient  Pt denies issues with constipation  No A fib or A flutter  EMMI video sent to pt's e mail pt declined   

## 2017-10-24 ENCOUNTER — Other Ambulatory Visit: Payer: Self-pay

## 2017-10-24 ENCOUNTER — Encounter: Payer: Self-pay | Admitting: Gastroenterology

## 2017-10-24 ENCOUNTER — Ambulatory Visit (AMBULATORY_SURGERY_CENTER): Payer: BLUE CROSS/BLUE SHIELD | Admitting: Gastroenterology

## 2017-10-24 VITALS — BP 103/57 | HR 53 | Temp 96.0°F | Resp 11 | Ht 66.0 in | Wt 166.0 lb

## 2017-10-24 DIAGNOSIS — Z1211 Encounter for screening for malignant neoplasm of colon: Secondary | ICD-10-CM | POA: Diagnosis present

## 2017-10-24 DIAGNOSIS — Z1212 Encounter for screening for malignant neoplasm of rectum: Secondary | ICD-10-CM | POA: Diagnosis not present

## 2017-10-24 MED ORDER — SODIUM CHLORIDE 0.9 % IV SOLN: 500.0000 mL | INTRAVENOUS | Status: DC

## 2017-10-24 NOTE — Progress Notes (Signed)
Report given to PACU, vss 

## 2017-10-24 NOTE — Patient Instructions (Signed)
YOU HAD AN ENDOSCOPIC PROCEDURE TODAY AT THE [facility]:   Refer to the procedure report that was given to you for any specific questions about what was found during the examination.  If the procedure report does not answer your questions, please call your gastroenterologist to clarify.  If you requested that your care partner not be given the details of your procedure findings, then the procedure report has been included in a sealed envelope for you to review at your convenience later.  YOU SHOULD EXPECT: Some feelings of bloating in the abdomen. Passage of more gas than usual.  Walking can help get rid of the air that was put into your GI tract during the procedure and reduce the bloating. If you had a lower endoscopy (such as a colonoscopy or flexible sigmoidoscopy) you may notice spotting of blood in your stool or on the toilet paper. If you underwent a bowel prep for your procedure, you may not have a normal bowel movement for a few days.  Please Note:  You might notice some irritation and congestion in your nose or some drainage.  This is from the oxygen used during your procedure.  There is no need for concern and it should clear up in a day or so.  SYMPTOMS TO REPORT IMMEDIATELY:   Following lower endoscopy (colonoscopy or flexible sigmoidoscopy):  Excessive amounts of blood in the stool  Significant tenderness or worsening of abdominal pains  Swelling of the abdomen that is new, acute  Fever of 100F or higher  For urgent or emergent issues, a gastroenterologist can be reached at any hour by calling [phone].   DIET:  We do recommend a small meal at first, but then you may proceed to your regular diet.  Drink plenty of fluids but you should avoid alcoholic beverages for 24 hours.  MEDICATIONS:  Continue present medications.  ACTIVITY:  You should plan to take it easy for the rest of today and you should NOT DRIVE or use heavy machinery until tomorrow (because of the  sedation medicines used during the test).    FOLLOW UP: Our staff will call the number listed on your records the next business day following your procedure to check on you and address any questions or concerns that you may have regarding the information given to you following your procedure. If we do not reach you, we will leave a message.  However, if you are feeling well and you are not experiencing any problems, there is no need to return our call.  We will assume that you have returned to your regular daily activities without incident.  If any biopsies were taken you will be contacted by phone or by letter within the next 1-3 weeks.  Please call us at [phone] if you have not heard about the biopsies in 3 weeks.   Thank you for allowing us to provide for your healthcare needs today.   SIGNATURES/CONFIDENTIALITY: You and/or your care partner have signed paperwork which will be entered into your electronic medical record.  These signatures attest to the fact that that the information above on your After Visit Summary has been reviewed and is understood.  Full responsibility of the confidentiality of this discharge information lies with you and/or your care-partner. 

## 2017-10-24 NOTE — Op Note (Signed)
[place] Patient Name: Michelle Ochoa Procedure Date: 10/24/2017 10:43 AM MRN: [id number] Endoscopist: Mallie Mussel L. Loletha Carrow , MD Age: 57 Referring MD:  Date of Birth: [birth year] Gender: Female Account #: [id number] Procedure:                Colonoscopy Indications:              Screening for colorectal malignant neoplasm, This                            is the patient's first colonoscopy Medicines:                Monitored Anesthesia Care Procedure:                Pre-Anesthesia Assessment:                           - Prior to the procedure, a History and Physical                            was performed, and patient medications and                            allergies were reviewed. The patient's tolerance of                            previous anesthesia was also reviewed. The risks                            and benefits of the procedure and the sedation                            options and risks were discussed with the patient.                            All questions were answered, and informed consent                            was obtained. Prior Anticoagulants: The patient has                            taken no previous anticoagulant or antiplatelet                            agents. ASA Grade Assessment: I - A normal, healthy                            patient. After reviewing the risks and benefits,                            the patient was deemed in satisfactory condition to                            undergo the procedure.  After obtaining informed consent, the colonoscope                            was passed under direct vision. Throughout the                            procedure, the patient's blood pressure, pulse, and                            oxygen saturations were monitored continuously. The                            Model CF-HQ190L [id number]) scope was introduced                            through the anus and advanced to the  the cecum,                            identified by appendiceal orifice and ileocecal                            valve. The colonoscopy was performed without                            difficulty. The patient tolerated the procedure                            well. The quality of the bowel preparation was                            good. The ileocecal valve, appendiceal orifice, and                            rectum were photographed. The quality of the bowel                            preparation was evaluated using the BBPS [facility]                            Bowel Preparation Scale) with scores of: Right                            Colon = 2, Transverse Colon = 2 and Left Colon = 2.                            The total BBPS score equals 6. Scope In: 10:45:14 AM Scope Out: 10:58:19 AM Scope Withdrawal Time: 0 hours 10 minutes 10 seconds  Total Procedure Duration: 0 hours 13 minutes 5 seconds  Findings:                 The perianal and digital rectal examinations were                            normal.  The entire examined colon appeared normal on direct                            and retroflexion views. Complications:            No immediate complications. Estimated Blood Loss:     Estimated blood loss: none. Impression:               - The entire examined colon is normal on direct and                            retroflexion views.                           - No specimens collected. Recommendation:           - Patient has a contact number available for                            emergencies. The signs and symptoms of potential                            delayed complications were discussed with the                            patient. Return to normal activities tomorrow.                            Written discharge instructions were provided to the                            patient.                           - Resume previous diet.                           -  Continue present medications.                           - Repeat colonoscopy in 10 years for screening                            purposes. Henry L. Loletha Carrow, MD 10/24/2017 11:00:53 AM This report has been signed electronically.

## 2017-10-24 NOTE — Progress Notes (Signed)
Pt's states no medical or surgical changes since previsit or office visit. 

## 2017-10-25 ENCOUNTER — Telehealth: Payer: Self-pay

## 2017-10-25 NOTE — Telephone Encounter (Signed)
  Follow up Call-  Call back number 10/24/2017  Post procedure Call Back phone  # [phone]  Permission to leave phone message Yes  Some recent data might be hidden     Patient questions:  Do you have a fever, pain , or abdominal swelling? No. Pain Score  0 *  Have you tolerated food without any problems? Yes.    Have you been able to return to your normal activities? Yes.    Do you have any questions about your discharge instructions: Diet   No. Medications  No. Follow up visit  No.  Do you have questions or concerns about your Care? No.  Actions: * If pain score is 4 or above: No action needed, pain <4.

## 2017-10-25 NOTE — Telephone Encounter (Signed)
  Follow up Call-  Call back number 10/24/2017  Post procedure Call Back phone  # [phone]  Permission to leave phone message Yes  Some recent data might be hidden     Left message

## 2017-11-02 ENCOUNTER — Ambulatory Visit: Payer: BLUE CROSS/BLUE SHIELD | Admitting: Family Medicine

## 2017-11-02 ENCOUNTER — Encounter: Payer: Self-pay | Admitting: Family Medicine

## 2017-11-02 VITALS — BP 97/65 | HR 66 | Temp 97.6°F | Wt 168.4 lb

## 2017-11-02 DIAGNOSIS — M25531 Pain in right wrist: Secondary | ICD-10-CM | POA: Diagnosis not present

## 2017-11-02 DIAGNOSIS — Z23 Encounter for immunization: Secondary | ICD-10-CM | POA: Diagnosis not present

## 2017-11-02 MED ORDER — MELOXICAM 15 MG PO TABS: 15.0000 mg | ORAL_TABLET | Freq: Every day | ORAL | 0 refills | Status: DC

## 2017-11-02 NOTE — Progress Notes (Signed)
Michelle Ochoa , 1961-03-27, 57 y.o., female MRN: [id number] Patient Care Team    Relationship Specialty Notifications Start End  Michelle Hillock, Michelle Ochoa PCP - General Family Medicine  05/20/16   Michelle Queen, Michelle Ochoa Consulting Physician Obstetrics and Gynecology  05/20/16     Chief Complaint  Patient presents with  . Wrist Pain    pts c/o of wrist pain with numbness of her knuckles for several months and she said it's getting worse especially at night     Subjective: Pt presents for an OV with complaints of several months history of right wrist discomfort of a couple month duration.  Associated symptoms include numbness and tingling sensation involving her right hand. Patient feels her symptoms are worse at night. He was diagnosed with carpal tunnel about 20 years ago during her pregnancy. She feels this is similar sensation.Pt has tried ibuprofen to ease their symptoms.   Depression screen [facility] 2/9 11/02/2017 05/25/2016  Decreased Interest 0 0  Down, Depressed, Hopeless 0 0  PHQ - 2 Score 0 0    Allergies  Allergen Reactions  . Codeine Nausea Only  . Vicodin [Hydrocodone-Acetaminophen] Nausea Only   Social History   Tobacco Use  . Smoking status: Never Smoker  . Smokeless tobacco: Never Used  Substance Use Topics  . Alcohol use: Yes    Alcohol/week: 4.8 oz    Types: 8 Glasses of wine per week   Past Medical History:  Diagnosis Date  . Anemia    with 1st childbirth  . Arthritis   . Basal cell carcinoma   . Chicken pox   . Headache   . Insomnia   . PONV (postoperative nausea and vomiting)   . Toe fracture, right 2012   rt great toe  . UTI (lower urinary tract infection)    Past Surgical History:  Procedure Laterality Date  . APPENDECTOMY  2005  . CESAREAN SECTION  [phone]  . LAPAROSCOPIC ABDOMINAL EXPLORATION     Family History  Problem Relation Age of Onset  . Breast cancer Mother        75  . Dementia Mother   . Prostate cancer Father   . Colon polyps  Father   . Diabetes Paternal Uncle   . Heart disease Paternal Uncle   . Early death Paternal Uncle   . Depression Maternal Grandmother   . Diabetes Maternal Grandfather   . Diabetes Paternal Grandfather   . Colon cancer Neg Hx   . Rectal cancer Neg Hx   . Stomach cancer Neg Hx    Allergies as of 11/02/2017      Reactions   Codeine Nausea Only   Vicodin [hydrocodone-acetaminophen] Nausea Only      Medication List        Accurate as of 11/02/17  3:58 PM. Always use your most recent med list.          b complex vitamins tablet Take 1 tablet by mouth daily.   clindamycin 1 % lotion Commonly known as:  CLEOCIN T APP EXT AA BID   PRESCRIPTION MEDICATION Topical cream for skin cancer-? Name   Vitamin D3 1000 units Caps Take 2,000 Units by mouth.       All past medical history, surgical history, allergies, family history, immunizations andmedications were updated in the EMR today and reviewed under the history and medication portions of their EMR.     ROS: Negative, with the exception of above mentioned in HPI   Objective:  BP 97/65 (BP Location: Left Arm, Patient Position: Sitting, Cuff Size: Normal)   Pulse 66   Temp 97.6 F (36.4 C) (Oral)   Wt 168 lb 6.4 oz (76.4 kg)   SpO2 100%   BMI 27.18 kg/m  Body mass index is 27.18 kg/m. Gen: Afebrile. No acute distress. Nontoxic in appearance, well developed, well nourished.  HENT: AT. [place]. MMM MSK: No erythema, no soft tissue swelling. Mildly tender to palpation radial aspect of wrist Full range of motion. Muscle skeletal strength 5/5 bilateral upper extremity. Strong against resisted pronation and supination without discomfort. Negative Tinel's at wrist or elbow. Allen test normal. Neurovascularly intact distally. Neuro:  Normal gait. PERLA. EOMi. Alert. Oriented x3  No exam data present No results found. No results found for this or any previous visit (from the past 24 hour(s)).  Assessment/Plan: LENDA BARATTA is  a 57 y.o. female present for OV for  Right wrist pain - Discussed with patient this is not classic for carpal tunnel syndrome. Reported symptoms are beyond the anatomical area supplied by median nerve. Allen test is normal. Tinel's are negative. - Discussed different etiologies including overuse given her weight lifting. Suggested anti-inflammatory daily use, night guard use. - DG Wrist Complete Right; Future given tenderness to palpation over radial aspect of bony prominence of wrist. Patient did not have this completed. - Meloxicam 15 mg daily prescribed. - Follow-up in 4 weeks. If no improvement symptoms consider referral to sports med further evaluation.  Influenza vaccine administered - Flu Vaccine QUAD 6+ mos PF IM (Fluarix Quad PF)   Reviewed expectations re: course of current medical issues.  Discussed self-management of symptoms.  Outlined signs and symptoms indicating need for more acute intervention.  Patient verbalized understanding and all questions were answered.  Patient received an After-Visit Summary.    No orders of the defined types were placed in this encounter.    Note is dictated utilizing voice recognition software. Although note has been proof read prior to signing, occasional typographical errors still can be missed. If any questions arise, please Michelle Ochoa not hesitate to call for verification.   electronically signed by:  Michelle Pouch, Michelle Ochoa  [place]

## 2017-11-02 NOTE — Patient Instructions (Signed)
Wear your splint at night.  Start mobic daily with food.   Have xray completed at the [facility] office tomorrow. I will call you with results once available.   Follow up in 4 weeks.

## 2017-11-04 ENCOUNTER — Encounter: Payer: Self-pay | Admitting: Family Medicine

## 2017-11-08 ENCOUNTER — Other Ambulatory Visit: Payer: Self-pay | Admitting: Family Medicine

## 2017-11-08 ENCOUNTER — Ambulatory Visit (INDEPENDENT_AMBULATORY_CARE_PROVIDER_SITE_OTHER)
Admission: RE | Admit: 2017-11-08 | Discharge: 2017-11-08 | Disposition: A | Discharge status: Home / Self Care | Payer: BLUE CROSS/BLUE SHIELD | Source: Ambulatory Visit | Attending: Family Medicine | Admitting: Family Medicine

## 2017-11-08 DIAGNOSIS — M25531 Pain in right wrist: Secondary | ICD-10-CM

## 2017-11-08 DIAGNOSIS — M79641 Pain in right hand: Secondary | ICD-10-CM | POA: Diagnosis not present

## 2017-11-08 DIAGNOSIS — S6991XA Unspecified injury of right wrist, hand and finger(s), initial encounter: Secondary | ICD-10-CM | POA: Diagnosis not present

## 2017-11-18 ENCOUNTER — Telehealth: Payer: Self-pay | Admitting: *Deleted

## 2017-11-18 DIAGNOSIS — M25539 Pain in unspecified wrist: Secondary | ICD-10-CM

## 2017-11-18 NOTE — Telephone Encounter (Signed)
Copied from [place]. Topic: Referral - Request >> Nov 17, 2017  2:12 PM Hewitt Shorts wrote: Reason for CRM: pt is wanting to get a referral for her hand pain -she feels that it could be a pinch nerve   We saw her for this on 11/02/17 note states if not better with medication in 4 weeks consider referral to Sports Med. Please advise

## 2017-11-18 NOTE — Telephone Encounter (Signed)
Spoke with patient reviewed information and instructions. Patient verbalized understanding. 

## 2017-11-18 NOTE — Telephone Encounter (Signed)
Pt was asked to f/u after 4 weeks of mobic and Carpal tunnel night guard. If she is worsening we can place a referral to sports med for her, however they will want her to have completed the above recs. She is to contiue the recs until they see her. Referral placed.

## 2017-11-24 ENCOUNTER — Encounter: Payer: Self-pay | Admitting: Sports Medicine

## 2017-11-24 ENCOUNTER — Ambulatory Visit: Payer: BLUE CROSS/BLUE SHIELD | Admitting: Sports Medicine

## 2017-11-24 ENCOUNTER — Ambulatory Visit: Payer: Self-pay

## 2017-11-24 VITALS — BP 102/66 | HR 68 | Ht 66.0 in | Wt 167.2 lb

## 2017-11-24 DIAGNOSIS — M25539 Pain in unspecified wrist: Secondary | ICD-10-CM

## 2017-11-24 DIAGNOSIS — G5601 Carpal tunnel syndrome, right upper limb: Secondary | ICD-10-CM

## 2017-11-24 DIAGNOSIS — R200 Anesthesia of skin: Secondary | ICD-10-CM

## 2017-11-24 NOTE — Progress Notes (Signed)
Juanda Bond. [place], [place] at [place]  RYN PEINE - 57 y.o. female MRN [id number]  Date of birth: [birth year]  Visit Date: 11/24/2017  PCP: Ma Hillock, DO   Referred by: Ma Hillock, DO   Scribe for today's visit: Josepha Pigg, CMA     SUBJECTIVE:  Michelle Ochoa is here for New Patient (Initial Visit) (RT wrist pain) .   Her RT wrist pain symptoms INITIALLY: Began years ago but has been getting progressively worse. She does recall that her dog pulled the leash very hard one day and that hurt her wrist.  Described as mild soreness in joints of fingers and numbness that improves throughout the day. She has occasionally felt pain radiating into the arm.  Worsened with lying down at night, being sedentary.  Improved with movement.  Additional associated symptoms include: She has noticed for several years that her fingers go numb when she falls asleep. Normally its the 3-5 fingers. She denies neck pain. Sister and father have fibromyalgia. She has hx of positive ANA in the past. At times she will have sx in the LT hand but nothing compared to sensation in the RT hand.   At this time symptoms are worsening compared to onset  She has been wearing a brace that she had during pregnancy for carpal tunnel. She has tried adjusting sleeping position.   She had xray of the RT wrist.    ROS Reports night time disturbances. Denies fevers, chills, or night sweats. Denies unexplained weight loss. Reports personal history of cancer, basal cell carcinoma on face. Denies changes in bowel or bladder habits. Denies recent unreported falls. Denies new or worsening dyspnea or wheezing. Reports headaches or dizziness.  Reports numbness, tingling or weakness  In the extremities.  Denies dizziness or presyncopal episodes Denies lower extremity edema     HISTORY & PERTINENT PRIOR DATA:  Prior History reviewed and updated per  electronic medical record.  Significant/pertinent history, findings, studies include:  reports that she has never smoked. She has never used smokeless tobacco. No results for input(s): HGBA1C, LABURIC, CREATINE in the last 8760 hours. No specialty comments available. No problems updated.  OBJECTIVE:  VS:  HT:5\' 6"  (167.6 cm)   WT:167 lb 3.2 oz (75.8 kg)  BMI:27    BP:102/66  HR:68bpm  TEMP: ( )  RESP:98 %   PHYSICAL EXAM: Constitutional: WDWN, Non-toxic appearing. Psychiatric: Alert & appropriately interactive.  Not depressed or anxious appearing. Respiratory: No increased work of breathing.  Trachea Midline Eyes: Pupils are equal.  EOM intact without nystagmus.  No scleral icterus  Vascular Exam: warm to touch no edema  upper extremity neuro exam: normal strength normal reflexes  MSK Exam: Bilateral hands overall well aligned.  She has positive carpal tunnel compression test.  Grip strength is intact.   ASSESSMENT & PLAN:   1. Numbness of right hand   2. Pain in wrist, unspecified laterality   3. Carpal tunnel syndrome of right wrist     PLAN: Ultrasound-guided hydrodissection of the right median nerve today.  This does measure 0.13 cm on ultrasound which does confer mild to moderate carpal tunnel syndrome.  Left is 0.9 cm Discussed night splints and bracing.  Follow-up: Return in about 4 weeks (around 12/22/2017).     Please see additional documentation for Objective, Assessment and Plan sections. Pertinent additional documentation may be included in corresponding procedure notes, imaging studies, problem based documentation  and patient instructions. Please see these sections of the encounter for additional information regarding this visit.  CMA/ATC served as Education administrator during this visit. History, Physical, and Plan performed by medical provider. Documentation and orders reviewed and attested to.      Gerda Diss, [place] Sports Medicine Physician

## 2017-11-24 NOTE — Patient Instructions (Addendum)
You had an injection today.  Things to be aware of after injection are listed below: . You may experience no significant improvement or even a slight worsening in your symptoms during the first 24 to 48 hours.  After that we expect your symptoms to improve gradually over the next 2 weeks for the medicine to have its maximal effect.  You should continue to have improvement out to 6 weeks after your injection. . Dr. Paulla Fore recommends icing the site of the injection for 20 minutes  1-2 times the day of your injection . You may shower but no swimming, tub bath or Jacuzzi for 24 hours. . If your bandage falls off this does not need to be replaced.  It is appropriate to remove the bandage after 4 hours. . You may resume light activities as tolerated unless otherwise directed per Dr. Paulla Fore during your visit  POSSIBLE STEROID SIDE EFFECTS:  Side effects from injectable steroids tend to be less than when taken orally however you may experience some of the symptoms listed below.  If experienced these should only last for a short period of time. Change in menstrual flow  Edema (swelling)  Increased appetite Skin flushing (redness)  Skin rash/acne  Thrush (oral) Yeast vaginitis    Increased sweating  Depression Increased blood glucose levels Cramping and leg/calf  Euphoria (feeling happy)  POSSIBLE PROCEDURE SIDE EFFECTS: The side effects of the injection are usually fairly minimal however if you may experience some of the following side effects that are usually self-limited and will is off on their own.  If you are concerned please feel free to call the office with questions:  Increased numbness or tingling  Nausea or vomiting  Swelling or bruising at the injection site   Please call our office if if you experience any of the following symptoms over the next week as these can be signs of infection:   Fever greater than 100.7F  Significant swelling at the injection site  Significant redness or drainage  from the injection site  If after 2 weeks you are continuing to have worsening symptoms please call our office to discuss what the next appropriate actions should be including the potential for a return office visit or other diagnostic testing.    Please perform the exercise program that we have prepared for you and gone over in detail on a daily basis.  In addition to the handout you were provided you can access your program through: [email or web address]   Your unique program code is: [email or web address]   Also check out [employer]" which is a program developed by Dr. Minerva Ends.   There are links to a couple of his YouTube Videos below and I would like to see performing one of his videos 5-6 days per week.    A good intro video is: "Independence from Pain 7-minute Video" - [email or web address]   His more advanced video is: "Powerful Posture and Pain Relief: 12 minutes of Foundation Training" - https://youtu.be/[email or web address]  Do not try to attempt this entire video when first beginning.    Try breaking of each exercise that he goes into shorter segments.  Otherwise if they perform an exercise for 45 seconds, start with 15 seconds and rest and then resume when they begin the new activity.    If you work your way up to doing this 12 minute video, I expect you will see significant improvements in your pain.  If you enjoy his videos and would like to find out more  you can look on his website: [email or web address]  He has a workout streaming option as well as a DVD set available for purchase.  Amazon has the best price for his DVDs.

## 2017-12-13 DIAGNOSIS — F4321 Adjustment disorder with depressed mood: Secondary | ICD-10-CM | POA: Diagnosis not present

## 2017-12-19 DIAGNOSIS — F4322 Adjustment disorder with anxiety: Secondary | ICD-10-CM | POA: Diagnosis not present

## 2017-12-26 DIAGNOSIS — F4322 Adjustment disorder with anxiety: Secondary | ICD-10-CM | POA: Diagnosis not present

## 2018-01-02 DIAGNOSIS — F4322 Adjustment disorder with anxiety: Secondary | ICD-10-CM | POA: Diagnosis not present

## 2018-01-03 ENCOUNTER — Encounter: Payer: Self-pay | Admitting: Sports Medicine

## 2018-01-03 ENCOUNTER — Ambulatory Visit: Payer: BLUE CROSS/BLUE SHIELD | Admitting: Sports Medicine

## 2018-01-03 VITALS — BP 112/76 | HR 58 | Ht 66.0 in | Wt 169.6 lb

## 2018-01-03 DIAGNOSIS — M25531 Pain in right wrist: Secondary | ICD-10-CM | POA: Diagnosis not present

## 2018-01-03 DIAGNOSIS — R200 Anesthesia of skin: Secondary | ICD-10-CM | POA: Diagnosis not present

## 2018-01-03 DIAGNOSIS — M25539 Pain in unspecified wrist: Secondary | ICD-10-CM

## 2018-01-03 NOTE — Progress Notes (Signed)
  Michelle Ochoa - 57 y.o. female MRN [id number]  Date of birth: 07/13/1961  Scribe for today's visit: Josepha Pigg, CMA     SUBJECTIVE:  Michelle Ochoa is here for No chief complaint on file.  11/24/17: Her RT wrist pain symptoms INITIALLY: Began years ago but has been getting progressively worse. She does recall that her dog pulled the leash very hard one day and that hurt her wrist.  Described as mild soreness in joints of fingers and numbness that improves throughout the day. She has occasionally felt pain radiating into the arm.  Worsened with lying down at night, being sedentary.  Improved with movement.  Additional associated symptoms include: She has noticed for several years that her fingers go numb when she falls asleep. Normally its the 3-5 fingers. She denies neck pain. Sister and father have fibromyalgia. She has hx of positive ANA in the past. At times she will have sx in the LT hand but nothing compared to sensation in the RT hand.  At this time symptoms are worsening compared to onset  She has been wearing a brace that she had during pregnancy for carpal tunnel. She has tried adjusting sleeping position.  She had xray of the RT wrist.   01/03/18: Compared to the last office visit, her previously described symptoms show minimal change. She continues to have numbness in the R hand at night. She has improvement when lying on the L side with her R arm across her body. When she wakes up with the numbness, if she holds her arm straight out across the bed it improves.  Current symptoms are moderate & are nonradiating She has been wearing a wrist brace at bedtime. She has exercises but she hasn't been doing them regularly but when she does them she does get some short term relief.  She received steroid injection   She has began to notice numbness in the L thumb. She has felt a radiating pain in the L thumb. This is a new sx and started abot 2-3 weeks ago. She does have very mild  numbness in the rest of the fingers on the L hand.    ROS Reports night time disturbances. Denies fevers, chills, or night sweats. Denies unexplained weight loss. Reports personal history of cancer, basal cell carcinoma. Denies changes in bowel or bladder habits. Denies recent unreported falls. Denies new or worsening dyspnea or wheezing. Reports headaches or dizziness.  Reports numbness, tingling or weakness  In the extremities.  Denies dizziness or presyncopal episodes Denies lower extremity edema      Please see additional documentation for Objective, Assessment and Plan sections. Pertinent additional documentation may be included in corresponding procedure notes, imaging studies, problem based documentation and patient instructions. Please see these sections of the encounter for additional information regarding this visit.  CMA/ATC served as Education administrator during this visit. History, Physical, and Plan performed by medical provider. Documentation and orders reviewed and attested to.      Gerda Diss, [place] Sports Medicine Physician

## 2018-01-03 NOTE — Progress Notes (Signed)
   Juanda Bond. [place], [place] at [place] - 57 y.o. female MRN [id number]  Date of birth: 1961-05-25  Visit Date: 01/03/2018   PCP: Ma Hillock, DO   Referred by: Ma Hillock, DO  Please see additional documentation for HPI, review of systems.  HISTORY & PERTINENT PRIOR DATA:  Prior History reviewed and updated per electronic medical record.  Significant history, findings, studies and interim changes include:  reports that she has never smoked. She has never used smokeless tobacco. No results for input(s): HGBA1C, LABURIC, CREATINE in the last 8760 hours. No specialty comments available. No problems updated.  OBJECTIVE:  VS:  HT:5\' 6"  (167.6 cm)   WT:169 lb 9.6 oz (76.9 kg)  BMI:27.39    BP:112/76  HR: (Abnormal) 58 bpm  TEMP: ( )  RESP:98 %   PHYSICAL EXAM: Constitutional: WDWN, Non-toxic appearing. Psychiatric: Alert & appropriately interactive.  Not depressed or anxious appearing. Respiratory: No increased work of breathing.  Trachea Midline Eyes: Pupils are equal.  EOM intact without nystagmus.  No scleral icterus  NEUROVASCULAR exam: No clubbing or cyanosis appreciated No significant venous stasis changes Capillary Refill: normal, less than 2 seconds  Extremities warm to touch, pink, with no edema.  Right wrist is overall well aligned.  She has reduced grip strength and mild amount of thenar eminence atrophy.  Positive Tinel's positive carpal tunnel compression test.   ASSESSMENT & PLAN:   1. Numbness of right hand   2. Pain in wrist, unspecified laterality   3. Right wrist pain    Orders & Meds:  Orders Placed This Encounter  Procedures  . Ambulatory referral to Neurology   No orders of the defined types were placed in this encounter.   PLAN: Symptoms are consistent with carpal tunnel syndrome.  Referral for neurology/EMGs to be performed.  Follow-up after this to  discuss options including potential for ultrasound-guided hydrodissection versus referral for surgical intervention if severe.  Follow-up: Return for after your NCV test for results review.

## 2018-01-09 DIAGNOSIS — F4322 Adjustment disorder with anxiety: Secondary | ICD-10-CM | POA: Diagnosis not present

## 2018-01-10 ENCOUNTER — Telehealth: Payer: Self-pay | Admitting: Sports Medicine

## 2018-01-10 NOTE — Telephone Encounter (Signed)
Looks like referral was placed to [facility]. Called pt and advised where referral has been sent. She has their number and will contact their office to schedule.

## 2018-01-10 NOTE — Telephone Encounter (Signed)
See note.  Copied from [place] [id number]. Topic: Referral - Question >> Jan 10, 2018 11:25 AM Ahmed Prima L wrote: Reason for CRM: pt said that when she checked out at her appt 3/19 that she was told a referral was going to be put in for neurology. Patient is following up on this. She said that it was going to be coming from Dr Paulla Fore. Please advise and call patient back @ [phone]

## 2018-01-11 ENCOUNTER — Encounter: Payer: Self-pay | Admitting: Neurology

## 2018-01-14 ENCOUNTER — Encounter: Payer: Self-pay | Admitting: Sports Medicine

## 2018-01-14 NOTE — Procedures (Signed)
PROCEDURE NOTE:  Ultrasound Guided: Injection: Right median nerve hydro-dissection at the carpal tunnel Images were obtained and interpreted by myself, Teresa Coombs, DO  Images have been saved and stored to PACS system. Images obtained on: GE S7 Ultrasound machine    ULTRASOUND FINDINGS:  Right Median nerve = 0.13cm2 Left Median Nerve = 0.09cm2  DESCRIPTION OF PROCEDURE:  The patient's clinical condition is marked by substantial pain and/or significant functional disability. Other conservative therapy has not provided relief, is contraindicated, or not appropriate. There is a reasonable likelihood that injection will significantly improve the patient's pain and/or functional impairment.   After discussing the risks, benefits and expected outcomes of the injection and all questions were reviewed and answered, the patient wished to undergo the above named procedure.  Verbal consent was obtained.  The ultrasound was used to identify the target structure and adjacent neurovascular structures. The skin was then prepped in sterile fashion and the target structure was injected under direct visualization using sterile technique as below:  PREP: Alcohol and Ethel Chloride APPROACH: ulnar sided, single injection, 25g 1.5 in. INJECTATE: 0.5 cc 1% lidocaine, 0.5 cc 0.5% Marcaine and 0.5 cc 40mg /mL DepoMedrol ASPIRATE: None DRESSING: Band-Aid  Post procedural instructions including recommending icing and warning signs for infection were reviewed.    This procedure was well tolerated and there were no complications.   IMPRESSION: Succesful Ultrasound Guided: Injection

## 2018-01-20 ENCOUNTER — Other Ambulatory Visit: Payer: Self-pay | Admitting: *Deleted

## 2018-01-20 DIAGNOSIS — R200 Anesthesia of skin: Secondary | ICD-10-CM

## 2018-01-24 ENCOUNTER — Ambulatory Visit (INDEPENDENT_AMBULATORY_CARE_PROVIDER_SITE_OTHER): Payer: BLUE CROSS/BLUE SHIELD | Admitting: Neurology

## 2018-01-24 DIAGNOSIS — R200 Anesthesia of skin: Secondary | ICD-10-CM | POA: Diagnosis not present

## 2018-01-24 DIAGNOSIS — F4322 Adjustment disorder with anxiety: Secondary | ICD-10-CM | POA: Diagnosis not present

## 2018-01-24 NOTE — Procedures (Signed)
[facility]  [address]  [place], [place] [address] Tel: [phone] Fax:  [phone] Test Date:  01/24/2018  Patient: Michelle Ochoa DOB: 04-09-1961 Physician: Narda Amber, DO  Sex: Female Height: 5\' 6"  Ref Phys: Teresa Coombs, DO  ID#: [id number] Temp: 33.9C Technician:    Patient Complaints: This is a 57 year old female referred for evaluation of right arm pain and paresthesia.  NCV & EMG Findings: Extensive electrodiagnostic testing of the right upper extremity shows:  1. Right median, ulnar, and mixed palmar sensory responses are within normal limits. 2. Right median and ulnar motor responses are within normal limits. 3. There is no evidence of active or chronic motor axon loss changes affecting any of the tested muscles. Motor unit configuration and recruitment pattern is within normal limits.  Impression: This is a normal study of the right upper extremity. In particular, there is no evidence of carpal tunnel syndrome or cervical radiculopathy.   ___________________________ Narda Amber, DO    Nerve Conduction Studies Anti Sensory Summary Table   Site NR Peak (ms) Norm Peak (ms) P-T Amp (V) Norm P-T Amp  Right Median Anti Sensory (2nd Digit)  33.9C  Wrist    2.7 <3.6 41.8 >15  Right Ulnar Anti Sensory (5th Digit)  33.9C  Wrist    2.6 <3.1 36.4 >10   Motor Summary Table   Site NR Onset (ms) Norm Onset (ms) O-P Amp (mV) Norm O-P Amp Site1 Site2 Delta-0 (ms) Dist (cm) Vel (m/s) Norm Vel (m/s)  Right Median Motor (Abd Poll Brev)  33.9C  Wrist    2.7 <4.0 10.8 >6 Elbow Wrist 4.4 27.0 61 >50  Elbow    7.1  10.6         Right Ulnar Motor (Abd Dig Minimi)  33.9C  Wrist    2.0 <3.1 9.5 >7 B Elbow Wrist 3.3 22.0 67 >50  B Elbow    5.3  9.5  A Elbow B Elbow 1.7 10.0 59 >50  A Elbow    7.0  8.8          Comparison Summary Table   Site NR Peak (ms) Norm Peak (ms) P-T Amp (V) Site1 Site2 Delta-P (ms) Norm Delta (ms)  Right Median/Ulnar  Palm Comparison (Wrist - 8cm)  33.9C  Median Palm    1.6 <2.2 77.9 Median Palm Ulnar Palm 0.1   Ulnar Palm    1.5 <2.2 21.7       EMG   Side Muscle Ins Act Fibs Psw Fasc Number Recrt Dur Dur. Amp Amp. Poly Poly. Comment  Right 1stDorInt Nml Nml Nml Nml Nml Nml Nml Nml Nml Nml Nml Nml N/A  Right PronatorTeres Nml Nml Nml Nml Nml Nml Nml Nml Nml Nml Nml Nml N/A  Right Biceps Nml Nml Nml Nml Nml Nml Nml Nml Nml Nml Nml Nml N/A  Right Triceps Nml Nml Nml Nml Nml Nml Nml Nml Nml Nml Nml Nml N/A  Right Deltoid Nml Nml Nml Nml Nml Nml Nml Nml Nml Nml Nml Nml N/A      Waveforms:

## 2018-01-26 ENCOUNTER — Telehealth: Payer: Self-pay

## 2018-01-26 NOTE — Telephone Encounter (Signed)
Per note - Impression: This is a normal study of the right upper extremity. In particular, there is no evidence of carpal tunnel syndrome or cervical radiculopathy. Dr. Paulla Fore, please advise of next steps for pt.

## 2018-01-26 NOTE — Telephone Encounter (Signed)
Copied from [place] [id number]. Topic: Quick Communication - Lab Results >> Jan 26, 2018 10:03 AM Lennox Solders wrote: Pt has a nerve conduction study done on 01-24-18 at [facility]. Pt would like the results

## 2018-02-01 ENCOUNTER — Telehealth: Payer: Self-pay | Admitting: Sports Medicine

## 2018-02-01 NOTE — Telephone Encounter (Signed)
See note. Dr. Paulla Fore ordered referral.   Copied from [place]. Topic: Quick Communication - Lab Results >> Jan 26, 2018 10:03 AM Lennox Solders wrote: Pt has a nerve conduction study done on 01-24-18 at [facility]. Pt would like the results >> Feb 01, 2018  4:14 PM Vernona Rieger wrote: Patient is concerned that nobody has called her for results. She was not sure if she was being called at a wrong number. Please call her at [phone]

## 2018-02-02 NOTE — Telephone Encounter (Signed)
Called pt and informed her of her NCV test results.  Recommended that pt return to the office for a f/u visit and the pt states that she will call back to schedule that appt.

## 2018-02-17 DIAGNOSIS — Z83511 Family history of glaucoma: Secondary | ICD-10-CM | POA: Diagnosis not present

## 2018-02-17 DIAGNOSIS — H40053 Ocular hypertension, bilateral: Secondary | ICD-10-CM | POA: Diagnosis not present

## 2018-02-17 DIAGNOSIS — H40023 Open angle with borderline findings, high risk, bilateral: Secondary | ICD-10-CM | POA: Diagnosis not present

## 2018-02-21 DIAGNOSIS — F4322 Adjustment disorder with anxiety: Secondary | ICD-10-CM | POA: Diagnosis not present

## 2018-02-22 DIAGNOSIS — M542 Cervicalgia: Secondary | ICD-10-CM | POA: Diagnosis not present

## 2018-02-22 DIAGNOSIS — R202 Paresthesia of skin: Secondary | ICD-10-CM | POA: Diagnosis not present

## 2018-02-23 ENCOUNTER — Encounter: Payer: Self-pay | Admitting: Sports Medicine

## 2018-02-28 DIAGNOSIS — F4322 Adjustment disorder with anxiety: Secondary | ICD-10-CM | POA: Diagnosis not present

## 2018-03-03 DIAGNOSIS — R202 Paresthesia of skin: Secondary | ICD-10-CM | POA: Diagnosis not present

## 2018-03-03 DIAGNOSIS — M542 Cervicalgia: Secondary | ICD-10-CM | POA: Diagnosis not present

## 2018-03-08 DIAGNOSIS — L570 Actinic keratosis: Secondary | ICD-10-CM | POA: Diagnosis not present

## 2018-03-08 DIAGNOSIS — L905 Scar conditions and fibrosis of skin: Secondary | ICD-10-CM | POA: Diagnosis not present

## 2018-03-08 DIAGNOSIS — Z85828 Personal history of other malignant neoplasm of skin: Secondary | ICD-10-CM | POA: Diagnosis not present

## 2018-03-09 DIAGNOSIS — F4322 Adjustment disorder with anxiety: Secondary | ICD-10-CM | POA: Diagnosis not present

## 2018-04-03 DIAGNOSIS — M542 Cervicalgia: Secondary | ICD-10-CM | POA: Diagnosis not present

## 2018-04-03 DIAGNOSIS — R202 Paresthesia of skin: Secondary | ICD-10-CM | POA: Diagnosis not present

## 2018-06-15 ENCOUNTER — Ambulatory Visit: Payer: BLUE CROSS/BLUE SHIELD | Admitting: Family

## 2018-06-15 DIAGNOSIS — Z0289 Encounter for other administrative examinations: Secondary | ICD-10-CM

## 2018-06-15 DIAGNOSIS — F4322 Adjustment disorder with anxiety: Secondary | ICD-10-CM | POA: Diagnosis not present

## 2018-06-16 ENCOUNTER — Other Ambulatory Visit (INDEPENDENT_AMBULATORY_CARE_PROVIDER_SITE_OTHER): Payer: BLUE CROSS/BLUE SHIELD

## 2018-06-16 ENCOUNTER — Ambulatory Visit: Payer: BLUE CROSS/BLUE SHIELD | Admitting: Family

## 2018-06-16 ENCOUNTER — Encounter: Payer: Self-pay | Admitting: Family

## 2018-06-16 VITALS — BP 106/72 | HR 65 | Temp 98.0°F | Ht 66.0 in | Wt 166.0 lb

## 2018-06-16 DIAGNOSIS — M542 Cervicalgia: Secondary | ICD-10-CM

## 2018-06-16 DIAGNOSIS — M791 Myalgia, unspecified site: Secondary | ICD-10-CM

## 2018-06-16 DIAGNOSIS — R5383 Other fatigue: Secondary | ICD-10-CM

## 2018-06-16 LAB — COMPREHENSIVE METABOLIC PANEL
ALT: 20 U/L (ref 0–35)
AST: 22 U/L (ref 0–37)
Albumin: 4.7 g/dL (ref 3.5–5.2)
Alkaline Phosphatase: 83 U/L (ref 39–117)
BUN: 11 mg/dL (ref 6–23)
CO2: 28 meq/L (ref 19–32)
Calcium: 9.7 mg/dL (ref 8.4–10.5)
Chloride: 102 mEq/L (ref 96–112)
Creatinine, Ser: 0.83 mg/dL (ref 0.40–1.20)
GFR: 75.9 mL/min (ref >60.00)
GLUCOSE: 132 mg/dL — AB (ref 70–99)
Potassium: 3.9 mEq/L (ref 3.5–5.1)
Sodium: 137 mEq/L (ref 135–145)
Total Bilirubin: 0.6 mg/dL (ref 0.2–1.2)
Total Protein: 7.9 g/dL (ref 6.0–8.3)

## 2018-06-16 LAB — CBC WITH DIFFERENTIAL/PLATELET
Basophils Absolute: 0.1 10*3/uL (ref 0.0–0.1)
Basophils Relative: 0.9 % (ref 0.0–3.0)
EOS PCT: 1.3 % (ref 0.0–5.0)
Eosinophils Absolute: 0.1 10*3/uL (ref 0.0–0.7)
HCT: 43.1 % (ref 36.0–46.0)
Hemoglobin: 14.5 g/dL (ref 12.0–15.0)
LYMPHS ABS: 1.8 10*3/uL (ref 0.7–4.0)
Lymphocytes Relative: 27.5 % (ref 12.0–46.0)
MCHC: 33.7 g/dL (ref 30.0–36.0)
MCV: 88 fl (ref 78.0–100.0)
Monocytes Absolute: 0.4 10*3/uL (ref 0.1–1.0)
Monocytes Relative: 6.1 % (ref 3.0–12.0)
NEUTROS ABS: 4.2 10*3/uL (ref 1.4–7.7)
NEUTROS PCT: 64.2 % (ref 43.0–77.0)
PLATELETS: 201 10*3/uL (ref 150.0–400.0)
RBC: 4.9 Mil/uL (ref 3.87–5.11)
RDW: 13.5 % (ref 11.5–15.5)
WBC: 6.6 10*3/uL (ref 4.0–10.5)

## 2018-06-16 LAB — C-REACTIVE PROTEIN: CRP: 0.1 mg/dL — ABNORMAL LOW (ref 0.5–20.0)

## 2018-06-16 LAB — TSH: TSH: 1.17 u[IU]/mL (ref 0.35–4.50)

## 2018-06-16 LAB — VITAMIN B12: Vitamin B-12: >1500 pg/mL — ABNORMAL HIGH (ref 211–911)

## 2018-06-16 LAB — SEDIMENTATION RATE: Sed Rate: 18 mm/hr (ref 0–30)

## 2018-06-16 MED ORDER — PREDNISONE 20 MG PO TABS: 20.0000 mg | ORAL_TABLET | Freq: Every day | ORAL | 0 refills | Status: DC

## 2018-06-16 NOTE — Progress Notes (Signed)
Michelle Ochoa is a 57 y.o. female with the following history as recorded in EpicCare:  Patient Active Problem List   Diagnosis Date Noted  . Vitamin D deficiency 06/08/2016  . Arthralgia 05/25/2016  . Tick bite 05/25/2016  . Family history of breast cancer 05/25/2016  . Elevated hemoglobin A1c 05/25/2016  . Encounter for general adult medical examination with abnormal findings 05/25/2016  . BMI 27.0-27.9,adult 05/25/2016    Current Outpatient Medications  Medication Sig Dispense Refill  . b complex vitamins tablet Take 1 tablet by mouth daily.    . Cholecalciferol (VITAMIN D3) 1000 units CAPS Take 2,000 Units by mouth.    . clindamycin (CLEOCIN T) 1 % lotion APP EXT AA BID  2  . PRESCRIPTION MEDICATION Topical cream for skin cancer-? Name    . predniSONE (DELTASONE) 20 MG tablet Take 1 tablet (20 mg total) by mouth daily with breakfast. 5 tablet 0   Current Facility-Administered Medications  Medication Dose Route Frequency Provider Last Rate Last Dose  . 0.9 %  sodium chloride infusion  500 mL Intravenous Continuous Danis, Kirke Corin, MD        Allergies: Codeine and Vicodin [hydrocodone-acetaminophen]  Past Medical History:  Diagnosis Date  . Anemia    with 1st childbirth  . Arthritis   . Basal cell carcinoma   . Chicken pox   . Headache   . Insomnia   . PONV (postoperative nausea and vomiting)   . Toe fracture, right 2012   rt great toe  . UTI (lower urinary tract infection)     Past Surgical History:  Procedure Laterality Date  . APPENDECTOMY  2005  . CESAREAN SECTION  [phone]  . LAPAROSCOPIC ABDOMINAL EXPLORATION      Family History  Problem Relation Age of Onset  . Breast cancer Mother        12  . Dementia Mother   . Prostate cancer Father   . Colon polyps Father   . Diabetes Paternal Uncle   . Heart disease Paternal Uncle   . Early death Paternal Uncle   . Depression Maternal Grandmother   . Diabetes Maternal Grandfather   . Diabetes Paternal  Grandfather   . Colon cancer Neg Hx   . Rectal cancer Neg Hx   . Stomach cancer Neg Hx     Social History   Tobacco Use  . Smoking status: Never Smoker  . Smokeless tobacco: Never Used  Substance Use Topics  . Alcohol use: Yes    Alcohol/week: 8.0 standard drinks    Types: 8 Glasses of wine per week    Subjective:  Patient presents today for urgent care visit; unable to schedule with her PCP today; Concerned about recent exposure to spray paint; throat has felt irritated for the past 1.5 weeks; worried about potential damage to the lungs; wonders if there is any type of testing that could be done other than CXR- hesitant about exposure to radiation;  Requesting imaging of her neck- persisting problems with numbness/ tingling in her fingertips; has seen neurology- had nerve conduction study; concerned for arthritis changes in the neck but hesitant to get X-ray today due to concerns for radiation exposure; Feels like has inflammatory process in her whole body;    Objective:  Vitals:   06/16/18 1314  BP: 106/72  Pulse: 65  Temp: 98 F (36.7 C)  TempSrc: Oral  SpO2: 98%  Weight: 166 lb 0.6 oz (75.3 kg)  Height: 5' 6"  (1.676 m)  General: Well developed, well nourished, in no acute distress  Skin : Warm and dry.  Head: Normocephalic and atraumatic  Eyes: Sclera and conjunctiva clear; pupils round and reactive to light; extraocular movements intact  Lungs: Respirations unlabored; clear to auscultation bilaterally without wheeze, rales, rhonchi  Musculoskeletal: No deformities; no active joint inflammation  Extremities: No edema, cyanosis, clubbing  Vessels: Symmetric bilaterally  Neurologic: Alert and oriented; speech intact; face symmetrical; moves all extremities well; CNII-XII intact without focal deficit   Assessment:  1. Neck pain   2. Myalgia   3. Other fatigue     Plan:  1. Patient defers X-ray today- will have her follow-up with Dr. Tamala Julian to discuss imaging; needs  to change to the [facility] location; may need MRI; follow-up to be determined. 2. & 3. Update labs to evaluate for auto-immune symptoms; follow-up to be determined. 4. Discussed short course of prednisone to help with throat irritation/ lung irritation- she is hesitant today; Rx given and she will consider; this could also help with myalgia.   Spent 30+ minutes with patient today;   Return for Dr. Smith/ neck pain/ needs [place] location.  Orders Placed This Encounter  Procedures  . DG Cervical Spine 2 or 3 views    Standing Status:   Future    Standing Expiration Date:   08/17/2019    Order Specific Question:   Reason for Exam (SYMPTOM  OR DIAGNOSIS REQUIRED)    Answer:   numbness/ tingling in fingertips; neck pain    Order Specific Question:   Is patient pregnant?    Answer:   No    Order Specific Question:   Preferred imaging location?    Answer:   [facility]    Order Specific Question:   Radiology Contrast Protocol - do NOT remove file path    Answer:   [email or web address]  . Antinuclear Antib (ANA)    Standing Status:   Future    Number of Occurrences:   1    Standing Expiration Date:   06/16/2019  . Rheumatoid Factor    Standing Status:   Future    Number of Occurrences:   1    Standing Expiration Date:   06/16/2019  . Sed Rate (ESR)    Standing Status:   Future    Number of Occurrences:   1    Standing Expiration Date:   06/16/2019  . C-reactive protein    Standing Status:   Future    Number of Occurrences:   1    Standing Expiration Date:   06/16/2019  . TSH    Standing Status:   Future    Number of Occurrences:   1    Standing Expiration Date:   06/16/2019  . B12    Standing Status:   Future    Number of Occurrences:   1    Standing Expiration Date:   06/16/2019  . CBC w/Diff    Standing Status:   Future    Number of Occurrences:   1    Standing Expiration Date:   06/16/2019  . Comp Met (CMET)    Standing Status:   Future     Number of Occurrences:   1    Standing Expiration Date:   06/16/2019    Requested Prescriptions   Signed Prescriptions Disp Refills  . predniSONE (DELTASONE) 20 MG tablet 5 tablet 0    Sig: Take 1 tablet (20 mg total) by mouth daily with breakfast.

## 2018-06-22 ENCOUNTER — Telehealth: Payer: Self-pay | Admitting: Family

## 2018-06-22 NOTE — Telephone Encounter (Signed)
Please let her know I am still waiting on ANA titer results; sorry for the delay-

## 2018-06-22 NOTE — Telephone Encounter (Signed)
Called and left message for patient that we are still waiting on the ANA tither results and we will call her once results are back. Created CRM incase she calls back.

## 2018-06-22 NOTE — Telephone Encounter (Signed)
Copied from [place] [id number]. Topic: General - Other >> Jun 22, 2018  9:13 AM Yvette Rack wrote: Reason for CRM: pt calling about lab results

## 2018-06-23 LAB — RHEUMATOID FACTOR

## 2018-06-23 LAB — ANTI-NUCLEAR AB-TITER (ANA TITER): ANA Titer 1: 1:40 {titer} — ABNORMAL HIGH

## 2018-06-23 LAB — ANA: Anti Nuclear Antibody(ANA): POSITIVE — AB

## 2018-06-23 NOTE — Telephone Encounter (Signed)
I sent her a message over MyChart about all the labs today so she must have a question about that. Can you find out her question and I will try to answer?

## 2018-06-23 NOTE — Telephone Encounter (Signed)
Patient calling for clarification on labs in [place]. Please advise

## 2018-06-26 NOTE — Telephone Encounter (Signed)
Reviewed with patient; reassurance given;

## 2018-07-05 NOTE — Progress Notes (Signed)
[facility] [address], [place] [address] Phone: [phone] Subjective:    I Michelle Ochoa am serving as a Education administrator for Dr. Hulan Saas.   I'm seeing this patient by the request  of:  Ochoa, Michelle A, DO   CC: Neck pain  EVO:[id number]  Michelle Ochoa is a 57 y.o. female coming in with complaint of neck pain. Numbness bilaterally. At night it hurts to bend fingers. Has had a nerve test. Has seen Dr. Paulla Ochoa for nerve conduction in the right arm. Started on the right now on the left. Neck pops and clicks on the right side. Feels lower back pain as well. Dad has spinal stenosis. Played field hockey in college. Right handed. States she feels a cold spot in her legs.   Onset- 6 months ago Location- neck Character-aching sensation. Aggravating factors- Severity-5 out of 10 but sometimes as bad as 8 out of 10   Reviewed patient's EMG with that was unremarkable.  Past Medical History:  Diagnosis Date  . Anemia    with 1st childbirth  . Arthritis   . Basal cell carcinoma   . Chicken pox   . Headache   . Insomnia   . PONV (postoperative nausea and vomiting)   . Toe fracture, right 2012   rt great toe  . UTI (lower urinary tract infection)    Past Surgical History:  Procedure Laterality Date  . APPENDECTOMY  2005  . CESAREAN SECTION  [phone]  . LAPAROSCOPIC ABDOMINAL EXPLORATION     Social History   Socioeconomic History  . Marital status: Married    Spouse name: Not on file  . Number of children: Not on file  . Years of education: Not on file  . Highest education level: Not on file  Occupational History  . Not on file  Social Needs  . Financial resource strain: Not on file  . Food insecurity:    Worry: Not on file    Inability: Not on file  . Transportation needs:    Medical: Not on file    Non-medical: Not on file  Tobacco Use  . Smoking status: Never Smoker  . Smokeless tobacco: Never Used  Substance and Sexual  Activity  . Alcohol use: Yes    Alcohol/week: 8.0 standard drinks    Types: 8 Glasses of wine per week  . Drug use: No  . Sexual activity: Yes    Partners: Male    Comment: Married; husband has vasectomy.  Lifestyle  . Physical activity:    Days per week: Not on file    Minutes per session: Not on file  . Stress: Not on file  Relationships  . Social connections:    Talks on phone: Not on file    Gets together: Not on file    Attends religious service: Not on file    Active member of club or organization: Not on file    Attends meetings of clubs or organizations: Not on file    Relationship status: Not on file  Other Topics Concern  . Not on file  Social History Narrative   Married, Michelle Ochoa). 3 adult children Michelle Ochoa, [place], [place]).   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    Takes daily vitamin   Wears seatbelt   exercises routinely.    Smoke detector in the home.    Feels safe in her relationships.    Allergies  Allergen Reactions  . Codeine  Nausea Only  . Vicodin [Hydrocodone-Acetaminophen] Nausea Only   Family History  Problem Relation Age of Onset  . Breast cancer Mother        71  . Dementia Mother   . Prostate cancer Father   . Colon polyps Father   . Diabetes Paternal Uncle   . Heart disease Paternal Uncle   . Early death Paternal Uncle   . Depression Maternal Grandmother   . Diabetes Maternal Grandfather   . Diabetes Paternal Grandfather   . Colon cancer Neg Hx   . Rectal cancer Neg Hx   . Stomach cancer Neg Hx     Current Outpatient Medications (Endocrine & Metabolic):  .  predniSONE (DELTASONE) 20 MG tablet, Take 1 tablet (20 mg total) by mouth daily with breakfast.           Current Outpatient Medications (Other):  .  b complex vitamins tablet, Take 1 tablet by mouth daily. .  Cholecalciferol (VITAMIN D3) 1000 units CAPS, Take 2,000 Units by mouth. .  clindamycin (CLEOCIN T) 1 % lotion, APP EXT AA BID .   PRESCRIPTION MEDICATION, Topical cream for skin cancer-? Name .  Vitamin D, Ergocalciferol, (DRISDOL) 50000 units CAPS capsule, Take 1 capsule (50,000 Units total) by mouth every 7 (seven) days.  Current Facility-Administered Medications (Other):  .  0.9 %  sodium chloride infusion    Past medical history, social, surgical and family history all reviewed in electronic medical record.  No pertanent information unless stated regarding to the chief complaint.   Review of Systems:  No headache, visual changes, nausea, vomiting, diarrhea, constipation, dizziness, abdominal pain, skin rash, fevers, chills, night sweats, weight loss, swollen lymph nodes, , chest pain, shortness of breath, mood changes.  Positive muscle aches, body aches  Objective  Blood pressure 104/64, pulse 60, height 5\' 6"  (1.676 m), weight 166 lb (75.3 kg), SpO2 98 %.   General: No apparent distress alert and oriented x3 mood and affect normal, dressed appropriately.  HEENT: Pupils equal, extraocular movements intact  Respiratory: Patient's speak in full sentences and does not appear short of breath  Cardiovascular: No lower extremity edema, non tender, no erythema  Skin: Warm dry intact with no signs of infection or rash on extremities or on axial skeleton.  Abdomen: Soft nontender  Neuro: Cranial nerves II through XII are intact, neurovascularly intact in all extremities with 2+ DTRs and 2+ pulses.  Lymph: No lymphadenopathy of posterior or anterior cervical chain or axillae bilaterally.  Gait normal with good balance and coordination.  MSK:  Non tender with full range of motion and good stability and symmetric strength and tone of shoulders, elbows, wrist, hip, knee and ankles bilaterally.  Neck: Inspection loss of lordosis. No palpable stepoffs. Negative Spurling's maneuver. Mild limited range of motion lacking last 5 degrees of extension and sidebending Grip strength and sensation normal in bilateral  hands Strength good C4 to T1 distribution No sensory change to C4 to T1 Negative Hoffman sign bilaterally Reflexes normal Tightness in the right trapezius  Osteopathic findings C2 flexed rotated and side bent right C6 flexed rotated and side bent left T3 extended rotated and side bent right inhaled third rib T5 extended rotated and side bent left L2 flexed rotated and side bent right Sacrum right on right   [id number]; 15 additional minutes spent for Therapeutic exercises as stated in above notes.  This included exercises focusing on stretching, strengthening, with significant focus on eccentric aspects.   Long term goals include  an improvement in range of motion, strength, endurance as well as avoiding reinjury. Patient's frequency would include in 1-2 times a day, 3-5 times a week for a duration of 6-12 weeks. Exercises that included:  Basic scapular stabilization to include adduction and depression of scapula Scaption, focusing on proper movement and good control Internal and External rotation utilizing a theraband, with elbow tucked at side entire time Rows with theraband which was given    Proper technique shown and discussed handout in great detail with ATC.  All questions were discussed and answered.     Impression and Recommendations:     This case required medical decision making of moderate complexity. The above documentation has been reviewed and is accurate and complete Lyndal Pulley, DO       Note: This dictation was prepared with Dragon dictation along with smaller phrase technology. Any transcriptional errors that result from this process are unintentional.

## 2018-07-06 ENCOUNTER — Ambulatory Visit: Payer: BLUE CROSS/BLUE SHIELD | Admitting: Family Medicine

## 2018-07-06 ENCOUNTER — Encounter: Payer: Self-pay | Admitting: Family Medicine

## 2018-07-06 DIAGNOSIS — E559 Vitamin D deficiency, unspecified: Secondary | ICD-10-CM | POA: Diagnosis not present

## 2018-07-06 DIAGNOSIS — M999 Biomechanical lesion, unspecified: Secondary | ICD-10-CM

## 2018-07-06 DIAGNOSIS — M940 Chondrocostal junction syndrome [Tietze]: Secondary | ICD-10-CM | POA: Diagnosis not present

## 2018-07-06 MED ORDER — VITAMIN D (ERGOCALCIFEROL) 1.25 MG (50000 UNIT) PO CAPS: 50000.0000 [IU] | ORAL_CAPSULE | ORAL | 0 refills | Status: DC

## 2018-07-06 NOTE — Patient Instructions (Addendum)
Good to see you  Michelle Ochoa is your friend.  Exercises 3 times a week.  Keep hands with in peripheral vision  Posture will be key  Once weekly vitamin D for 12 weeks sleep with elbow below shoulder See me again in 4 weeks

## 2018-07-07 DIAGNOSIS — M999 Biomechanical lesion, unspecified: Secondary | ICD-10-CM | POA: Insufficient documentation

## 2018-07-07 DIAGNOSIS — M940 Chondrocostal junction syndrome [Tietze]: Secondary | ICD-10-CM | POA: Insufficient documentation

## 2018-07-07 NOTE — Assessment & Plan Note (Signed)
Decision today to treat with OMT was based on Physical Exam  After verbal consent patient was treated with HVLA, ME, FPR techniques in cervical, thoracic, rib, lumbar and sacral areas  Patient tolerated the procedure well with improvement in symptoms  Patient given exercises, stretches and lifestyle modifications  See medications in patient instructions if given  Patient will follow up in 4 weeks 

## 2018-07-07 NOTE — Assessment & Plan Note (Signed)
I believe the patient's symptoms are secondary to posture, ergonomics, muscle imbalances.  Slipped rib syndrome noted.  Patient does have large breasts that could be potentially playing a role tube.  We discussed different ergonomic changes, scapular exercises given.  Discussed posture on a daily basis as well.  Responded well to manipulation.  Follow-up again in 4 weeks

## 2018-07-07 NOTE — Assessment & Plan Note (Signed)
Could be playing a role in the muscle endurance.  Once weekly vitamin D encouraged.

## 2018-07-17 ENCOUNTER — Encounter: Payer: Self-pay | Admitting: Family

## 2018-07-17 ENCOUNTER — Ambulatory Visit: Payer: Self-pay

## 2018-07-17 ENCOUNTER — Ambulatory Visit: Payer: BLUE CROSS/BLUE SHIELD | Admitting: Family

## 2018-07-17 ENCOUNTER — Ambulatory Visit (INDEPENDENT_AMBULATORY_CARE_PROVIDER_SITE_OTHER)
Admission: RE | Admit: 2018-07-17 | Discharge: 2018-07-17 | Disposition: A | Discharge status: Home / Self Care | Payer: BLUE CROSS/BLUE SHIELD | Source: Ambulatory Visit | Attending: Family | Admitting: Family

## 2018-07-17 VITALS — BP 108/68 | HR 69 | Temp 98.0°F | Ht 66.0 in | Wt 162.1 lb

## 2018-07-17 DIAGNOSIS — R42 Dizziness and giddiness: Secondary | ICD-10-CM | POA: Diagnosis not present

## 2018-07-17 DIAGNOSIS — H8149 Vertigo of central origin, unspecified ear: Secondary | ICD-10-CM | POA: Diagnosis not present

## 2018-07-17 DIAGNOSIS — R29818 Other symptoms and signs involving the nervous system: Secondary | ICD-10-CM

## 2018-07-17 DIAGNOSIS — R200 Anesthesia of skin: Secondary | ICD-10-CM | POA: Diagnosis not present

## 2018-07-17 DIAGNOSIS — M542 Cervicalgia: Secondary | ICD-10-CM | POA: Diagnosis not present

## 2018-07-17 DIAGNOSIS — M4802 Spinal stenosis, cervical region: Secondary | ICD-10-CM | POA: Diagnosis not present

## 2018-07-17 DIAGNOSIS — R202 Paresthesia of skin: Secondary | ICD-10-CM

## 2018-07-17 DIAGNOSIS — H538 Other visual disturbances: Secondary | ICD-10-CM

## 2018-07-17 NOTE — Patient Instructions (Signed)
Please call your eye doctor today- you need to be seen within the next 24 hours;

## 2018-07-17 NOTE — Telephone Encounter (Signed)
Call returned to patient who had C/O dizziness Saturday after workout jumping on a trampoline that she treated by drinking water. Stated the dizziness was better but she still notices it at times She took mucinex Saturday night because she felt she may have fluid in her ear. Today she is calling with a new symptom of blurred vision when waking up today. Pt can not say if it is only in one eye or both.  As we were speaking she said the vision was getting better. She tells a hx of a nerve impignment in her neck that is being treated. She denies headache or pain to either eye. She does notice numbness to her head when she lays down. Appointment per protocol. Care by [facility] office because pt Dr at [facility] is not available today. Pt will not see other Dr at [facility]. Pt has been seen at the [facility] office by Mindi Slicker. Appointment today to see L Murray at 1340. Reason for Disposition . [1] Brief (now gone) blurred vision AND [2] unexplained  Answer Assessment - Initial Assessment Questions 1. DESCRIPTION: "What is the vision loss like? Describe it for me." (e.g., complete vision loss, blurred vision, double vision, floaters, etc.)     Blurred 2. LOCATION: "One or both eyes?" If one, ask: "Which eye?"     unsure 3. SEVERITY: "Can you see anything?" If so, ask: "What can you see?" (e.g., fine print)     Yes better than whe 1st up 4. ONSET: "When did this begin?" "Did it start suddenly or has this been gradual?"     This am 5. PATTERN: "Does this come and go, or has it been constant since it started?"     No just getting better 6. PAIN: "Is there any pain in your eye(s)?"  (Scale 1-10; or mild, moderate, severe)     Numbness to head off and on 7. CONTACTS-GLASSES: "Do you wear contacts or glasses?"     glasses 8. CAUSE: "What do you think is causing this visual problem?"     Nerve damage dx to neck 9. OTHER SYMPTOMS: "Do you have any other symptoms?" (e.g., confusion, headache, arm or leg weakness, speech  problems)    Dizziness, nauses 10. PREGNANCY: "Is there any chance you are pregnant?" "When was your last menstrual period?"       no  Protocols used: [place]

## 2018-07-17 NOTE — Progress Notes (Signed)
Michelle Ochoa is a 57 y.o. female with the following history as recorded in EpicCare:  Patient Active Problem List   Diagnosis Date Noted  . Slipped rib syndrome 07/07/2018  . Nonallopathic lesion of thoracic region 07/07/2018  . Nonallopathic lesion of cervical region 07/07/2018  . Nonallopathic lesion of rib cage 07/07/2018  . Nonallopathic lesion of lumbosacral region 07/07/2018  . Nonallopathic lesion of sacral region 07/07/2018  . Vitamin D deficiency 06/08/2016  . Arthralgia 05/25/2016  . Tick bite 05/25/2016  . Family history of breast cancer 05/25/2016  . Elevated hemoglobin A1c 05/25/2016  . Encounter for general adult medical examination with abnormal findings 05/25/2016  . BMI 27.0-27.9,adult 05/25/2016    Current Outpatient Medications  Medication Sig Dispense Refill  . Vitamin D, Ergocalciferol, (DRISDOL) 50000 units CAPS capsule Take 1 capsule (50,000 Units total) by mouth every 7 (seven) days. 12 capsule 0  . b complex vitamins tablet Take 1 tablet by mouth daily.    . Cholecalciferol (VITAMIN D3) 1000 units CAPS Take 2,000 Units by mouth.    . clindamycin (CLEOCIN T) 1 % lotion APP EXT AA BID  2  . PRESCRIPTION MEDICATION Topical cream for skin cancer-? Name     No current facility-administered medications for this visit.     Allergies: Codeine and Vicodin [hydrocodone-acetaminophen]  Past Medical History:  Diagnosis Date  . Anemia    with 1st childbirth  . Arthritis   . Basal cell carcinoma   . Chicken pox   . Headache   . Insomnia   . PONV (postoperative nausea and vomiting)   . Toe fracture, right 2012   rt great toe  . UTI (lower urinary tract infection)     Past Surgical History:  Procedure Laterality Date  . APPENDECTOMY  2005  . CESAREAN SECTION  [phone]  . LAPAROSCOPIC ABDOMINAL EXPLORATION      Family History  Problem Relation Age of Onset  . Breast cancer Mother        60  . Dementia Mother   . Prostate cancer Father   . Colon  polyps Father   . Diabetes Paternal Uncle   . Heart disease Paternal Uncle   . Early death Paternal Uncle   . Depression Maternal Grandmother   . Diabetes Maternal Grandfather   . Diabetes Paternal Grandfather   . Colon cancer Neg Hx   . Rectal cancer Neg Hx   . Stomach cancer Neg Hx     Social History   Tobacco Use  . Smoking status: Never Smoker  . Smokeless tobacco: Never Used  Substance Use Topics  . Alcohol use: Yes    Alcohol/week: 8.0 standard drinks    Types: 8 Glasses of wine per week    Subjective:  Had an episode of dizziness Saturday evening after working out; went to bed and woke up Sunday morning still "feeling off." Took Mucinex and Triamterene/ HCTZ ( 1/2 tablet) yesterday- felt somewhat better as the day progressed; woke up this morning and noticed that her vision was blurry- vision seemed to clear within an hour; no "official" diagnosis of migraines/ ophthalmic migraines; notes that time of office visit, feels "more normal"- tired, "a little off"; Denies any chest pain, shortness of breath, blurred vision or headache. Did not feel her heart beating irregularly during past 2 days;  Also has decided she would like to get X-ray of her neck updated- continuing to have problems with numbness/ tingling in both of her hands/ seems to be  progressively worsening; scheduled to follow-up with sports medicine in a few weeks.     Objective:  Vitals:   07/17/18 1348  BP: 108/68  Pulse: 69  Temp: 98 F (36.7 C)  TempSrc: Oral  SpO2: 97%  Weight: 162 lb 1.3 oz (73.5 kg)  Height: 5\' 6"  (1.676 m)    General: Well developed, well nourished, in no acute distress  Skin : Warm and dry.  Head: Normocephalic and atraumatic  Eyes: Sclera and conjunctiva clear; pupils round and reactive to light; extraocular movements intact  Ears: External normal; canals clear; tympanic membranes normal  Oropharynx: Pink, supple. No suspicious lesions  Neck: Supple without thyromegaly,  adenopathy  Lungs: Respirations unlabored; clear to auscultation bilaterally without wheeze, rales, rhonchi  CVS exam: normal rate and regular rhythm.  Abdomen: Soft; nontender; nondistended; normoactive bowel sounds; no masses or hepatosplenomegaly  Musculoskeletal: No deformities; no active joint inflammation  Extremities: No edema, cyanosis, clubbing  Vessels: Symmetric bilaterally  Neurologic: Alert and oriented; speech intact; face symmetrical; moves all extremities well; CNII-XII intact without focal deficit  Assessment:  1. Dizziness   2. Other symptoms and signs involving the nervous system   3. Blurred vision   4. Numbness and tingling in both hands     Plan:  By the time of office visit, patient's symptoms are resolving- vision is normal; she will plan to see her eye doctor within 24 hours; will hold CT due to patient concerns for radiation exposure- update brain MRI;  ? Ophthalmic migraine/ vertigo;  Update cervical x-ray today- follow-up to be determined.   No follow-ups on file.  Orders Placed This Encounter  Procedures  . MR Brain W Wo Contrast    Standing Status:   Future    Standing Expiration Date:   09/17/2019    Order Specific Question:   If indicated for the ordered procedure, I authorize the administration of contrast media per Radiology protocol    Answer:   Yes    Order Specific Question:   What is the patient's sedation requirement?    Answer:   No Sedation    Order Specific Question:   Does the patient have a pacemaker or implanted devices?    Answer:   Yes    Order Specific Question:   Radiology Contrast Protocol - do NOT remove file path    Answer:   [email or web address]    Order Specific Question:   Preferred imaging location?    Answer:   [facility] (table limit-550lbs)    Requested Prescriptions    No prescriptions requested or ordered in this encounter

## 2018-07-18 DIAGNOSIS — H401310 Pigmentary glaucoma, right eye, stage unspecified: Secondary | ICD-10-CM | POA: Diagnosis not present

## 2018-07-18 NOTE — Telephone Encounter (Signed)
She sent you a message already that I forwarded to you.

## 2018-07-20 ENCOUNTER — Other Ambulatory Visit: Payer: Self-pay | Admitting: Family

## 2018-07-20 DIAGNOSIS — M5410 Radiculopathy, site unspecified: Secondary | ICD-10-CM

## 2018-07-25 DIAGNOSIS — Z1231 Encounter for screening mammogram for malignant neoplasm of breast: Secondary | ICD-10-CM | POA: Diagnosis not present

## 2018-07-25 DIAGNOSIS — Z01419 Encounter for gynecological examination (general) (routine) without abnormal findings: Secondary | ICD-10-CM | POA: Diagnosis not present

## 2018-07-25 DIAGNOSIS — Z6826 Body mass index (BMI) 26.0-26.9, adult: Secondary | ICD-10-CM | POA: Diagnosis not present

## 2018-08-03 ENCOUNTER — Telehealth: Payer: Self-pay | Admitting: Family Medicine

## 2018-08-03 NOTE — Telephone Encounter (Signed)
Advised pt that the MRIs Mickel Baas order are the correct ones. When she comes in on the 22nd she will be able to go over the MRIs with Dr. Tamala Julian then. Pt understood.

## 2018-08-03 NOTE — Telephone Encounter (Signed)
Copied from [place] [id number]. Topic: General - Other >> Aug 03, 2018  9:59 AM Yvette Rack wrote: Reason for CRM: pt would like for Dr Tamala Julian or his assistant to give her a call back she would like for him to look at her xray of her neck and let let her know if she need a xray of shoulders and torso due to numbness of hands and fingers she has an MRI this weekend and want to know if anything else need to be done

## 2018-08-05 ENCOUNTER — Ambulatory Visit
Admission: RE | Admit: 2018-08-05 | Discharge: 2018-08-05 | Disposition: A | Discharge status: Home / Self Care | Payer: BLUE CROSS/BLUE SHIELD | Source: Ambulatory Visit | Attending: Family | Admitting: Family

## 2018-08-05 DIAGNOSIS — M5410 Radiculopathy, site unspecified: Secondary | ICD-10-CM

## 2018-08-05 DIAGNOSIS — R29818 Other symptoms and signs involving the nervous system: Secondary | ICD-10-CM

## 2018-08-07 NOTE — Progress Notes (Signed)
[facility] [address] [place], [place] [address] Phone: [phone] Subjective:   [name], am serving as a scribe for Dr. Hulan Saas.    CC: all overpain  [id number]  Michelle Ochoa is a 57 y.o. female coming in with complaint of numbness in the PIP joints of her fingers. Is unable to wear ring due to swelling in her right hand. Pain has now also gone to left hand. Patient is starting to experience crepitus in neck. Also is complaining of thoracic spine and lumbar pain. Patient is unable to get MRI taken on Saturday due to an anxiety attack.  Patient's neck pain seems to be worse with radiation down the arms bilaterally.  Patient did respond a little bit to manipulation but has not had as much improvement anymore.  Patient did have some dizziness previously and does not know if it was a secondary to the manipulation.  Denies though any significant headache at the moment.     Past Medical History:  Diagnosis Date  . Anemia    with 1st childbirth  . Arthritis   . Basal cell carcinoma   . Chicken pox   . Headache   . Insomnia   . PONV (postoperative nausea and vomiting)   . Toe fracture, right 2012   rt great toe  . UTI (lower urinary tract infection)    Past Surgical History:  Procedure Laterality Date  . APPENDECTOMY  2005  . CESAREAN SECTION  [phone]  . LAPAROSCOPIC ABDOMINAL EXPLORATION     Social History   Socioeconomic History  . Marital status: Married    Spouse name: Not on file  . Number of children: Not on file  . Years of education: Not on file  . Highest education level: Not on file  Occupational History  . Not on file  Social Needs  . Financial resource strain: Not on file  . Food insecurity:    Worry: Not on file    Inability: Not on file  . Transportation needs:    Medical: Not on file    Non-medical: Not on file  Tobacco Use  . Smoking status: Never Smoker  . Smokeless tobacco: Never Used    Substance and Sexual Activity  . Alcohol use: Yes    Alcohol/week: 8.0 standard drinks    Types: 8 Glasses of wine per week  . Drug use: No  . Sexual activity: Yes    Partners: Male    Comment: Married; husband has vasectomy.  Lifestyle  . Physical activity:    Days per week: Not on file    Minutes per session: Not on file  . Stress: Not on file  Relationships  . Social connections:    Talks on phone: Not on file    Gets together: Not on file    Attends religious service: Not on file    Active member of club or organization: Not on file    Attends meetings of clubs or organizations: Not on file    Relationship status: Not on file  Other Topics Concern  . Not on file  Social History Narrative   Married, Michelle [facility]). 3 adult children [name], [place], [place]).   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    Takes daily vitamin   Wears seatbelt   exercises routinely.    Smoke detector in the home.    Feels safe in her relationships.    Allergies  Allergen Reactions  .  Codeine Nausea Only  . Vicodin [Hydrocodone-Acetaminophen] Nausea Only   Family History  Problem Relation Age of Onset  . Breast cancer Mother        80  . Dementia Mother   . Prostate cancer Father   . Colon polyps Father   . Diabetes Paternal Uncle   . Heart disease Paternal Uncle   . Early death Paternal Uncle   . Depression Maternal Grandmother   . Diabetes Maternal Grandfather   . Diabetes Paternal Grandfather   . Colon cancer Neg Hx   . Rectal cancer Neg Hx   . Stomach cancer Neg Hx          Current Outpatient Medications (Other):  .  b complex vitamins tablet, Take 1 tablet by mouth daily. .  Cholecalciferol (VITAMIN D3) 1000 units CAPS, Take 2,000 Units by mouth. .  clindamycin (CLEOCIN T) 1 % lotion, APP EXT AA BID .  PRESCRIPTION MEDICATION, Topical cream for skin cancer-? Name .  Vitamin D, Ergocalciferol, (DRISDOL) 50000 units CAPS capsule, Take 1 capsule  (50,000 Units total) by mouth every 7 (seven) days. .  diazepam (VALIUM) 5 MG tablet, One tab by mouth, 2 hours before procedure.    Past medical history, social, surgical and family history all reviewed in electronic medical record.  No pertanent information unless stated regarding to the chief complaint.   Review of Systems:  No visual changes, nausea, vomiting, diarrhea, constipation, dizziness, abdominal pain, skin rash, fevers, chills, night sweats, weight loss, swollen lymph nodes,,  chest pain, shortness of breath, mood changes.  Positive muscle aches, body aches, joint swelling headaches  Objective  Blood pressure 122/82, pulse 68, height 5\' 6"  (1.676 m), weight 168 lb (76.2 kg), SpO2 98 %.   General: No apparent distress alert and oriented x3 mood and affect normal, dressed appropriately.  HEENT: Pupils equal, extraocular movements intact  Respiratory: Patient's speak in full sentences and does not appear short of breath  Cardiovascular: No lower extremity edema, non tender, no erythema  Skin: Warm dry intact with no signs of infection or rash on extremities or on axial skeleton.  Abdomen: Soft nontender  Neuro: Cranial nerves II through XII are intact, neurovascularly intact in all extremities with 2+ DTRs and 2+ pulses.  Lymph: No lymphadenopathy of posterior or anterior cervical chain or axillae bilaterally.  Gait antalgic MSK:  tender with mild limited range of motion and good stability and symmetric strength and tone of shoulders, elbows, wrist, hip, knee and ankles bilaterally.  Back exam has diffuse tenderness noted.  Paraspinal musculature lumbar spine.  Seems to be severe.  Mild positive Corky Sox.  Negative straight leg test.  Too much pain to allow patient to do the rest of the exam.  Neck: Inspection loss of lordosis  No palpable stepoffs. Positive Spurling's maneuver mild radicular symptoms bilaterally.. Mild limited range of motion in all planes Grip strength and  sensation normal in bilateral hands Strength good C4 to T1 distribution No sensory change to C4 to T1 Negative Hoffman sign bilaterally Reflexes normal     Impression and Recommendations:     This case required medical decision making of moderate complexity. The above documentation has been reviewed and is accurate and complete Lyndal Pulley, DO       Note: This dictation was prepared with Dragon dictation along with smaller phrase technology. Any transcriptional errors that result from this process are unintentional.

## 2018-08-08 ENCOUNTER — Encounter: Payer: Self-pay | Admitting: Family Medicine

## 2018-08-08 ENCOUNTER — Ambulatory Visit: Payer: BLUE CROSS/BLUE SHIELD | Admitting: Family Medicine

## 2018-08-08 VITALS — BP 122/82 | HR 68 | Ht 66.0 in | Wt 168.0 lb

## 2018-08-08 DIAGNOSIS — M2550 Pain in unspecified joint: Secondary | ICD-10-CM

## 2018-08-08 DIAGNOSIS — R5383 Other fatigue: Secondary | ICD-10-CM

## 2018-08-08 DIAGNOSIS — H40023 Open angle with borderline findings, high risk, bilateral: Secondary | ICD-10-CM | POA: Diagnosis not present

## 2018-08-08 MED ORDER — DIAZEPAM 5 MG PO TABS: ORAL_TABLET | ORAL | 0 refills | Status: DC

## 2018-08-08 NOTE — Patient Instructions (Addendum)
Good to see you  I am sorry you are hurting I agree with the MRI sent in valium for you  Sleep study ordered but wait until after your trip[ Read about effexor or cymbalta as possible treatment for fibromyalgia  See me again in otherwise in 3-4 weeks

## 2018-08-08 NOTE — Assessment & Plan Note (Signed)
Seems to be out of proportion.  Patient does have known degenerative disc disease of the cervical spine noted.  We attempted osteopathic manipulation previously but did not make any significant improvement.  At this point I would recommend the patient does have an MRI done.  Valium given.  Discussed icing regimen and home exercise.  Which activities to do which wants to avoid.  Follow-up again in 4 to 6 weeks.

## 2018-08-21 ENCOUNTER — Ambulatory Visit
Admission: RE | Admit: 2018-08-21 | Discharge: 2018-08-21 | Disposition: A | Discharge status: Home / Self Care | Payer: BLUE CROSS/BLUE SHIELD | Source: Ambulatory Visit | Attending: Family | Admitting: Family

## 2018-08-21 DIAGNOSIS — R202 Paresthesia of skin: Secondary | ICD-10-CM | POA: Diagnosis not present

## 2018-08-21 DIAGNOSIS — M542 Cervicalgia: Secondary | ICD-10-CM | POA: Diagnosis not present

## 2018-08-23 ENCOUNTER — Encounter: Payer: Self-pay | Admitting: Family Medicine

## 2018-08-23 ENCOUNTER — Encounter: Payer: Self-pay | Admitting: Family

## 2018-08-29 ENCOUNTER — Encounter: Payer: Self-pay | Admitting: Family Medicine

## 2018-08-29 DIAGNOSIS — R5383 Other fatigue: Secondary | ICD-10-CM

## 2018-09-06 ENCOUNTER — Encounter: Payer: Self-pay | Admitting: Family Medicine

## 2018-09-06 ENCOUNTER — Ambulatory Visit: Payer: BLUE CROSS/BLUE SHIELD | Admitting: Family Medicine

## 2018-09-06 DIAGNOSIS — M5030 Other cervical disc degeneration, unspecified cervical region: Secondary | ICD-10-CM

## 2018-09-06 DIAGNOSIS — M797 Fibromyalgia: Secondary | ICD-10-CM | POA: Insufficient documentation

## 2018-09-06 NOTE — Assessment & Plan Note (Signed)
Discussed HEP Discussed possible injection in the neck that could be beneficial at the C5-C6 area.  Patient declined that at this point.  We discussed multiple different treatment options including restarting formal physical therapy.  Patient has declined that as well.  We will try some over-the-counter medications.  Follow-up with me again in 4 to 6 weeks  Spent  25 minutes with patient face-to-face and had greater than 50% of counseling including as described above in assessment and plan.

## 2018-09-06 NOTE — Patient Instructions (Signed)
Good to see you  Calcium pyruvate 1500mg  daily can help  We will check on the sleep study  Cotinue the vitamin D Still consider the effexor  Stay active as much as you can and working out is great  Consider even pool therapy which would be less pressure See me again in 6-8 weeks

## 2018-09-06 NOTE — Progress Notes (Signed)
[facility] [address] [place], [place] [address] Phone: [phone] Subjective:   [name], am serving as a scribe for Dr. Hulan Saas.    CC: Neck and hand pain follow-up  [id number]  Michelle Ochoa is a 57 y.o. female coming in with complaint of neck and hand pain. Continues to have pain in both areas. Complains of hand swelling intermittently. Swelling in the 3rd -5th fingers. Patient is unable to wear rings due to swelling. Pain with flexion at night of her DIP and PIP joints. Pain use to go away with stretching but pain stays in hands longer. Patient also notes that when sitting at the computer she has similar symptoms so she wonders if this issue is not due to positions during her sleep.   Patient MRI of the neck recently was independently visualized by me showing a C5-C6 disc protrusion causing some nerve impingement.  This was independently visualized by me.  Otherwise fairly unremarkable.  Past Medical History:  Diagnosis Date  . Anemia    with 1st childbirth  . Arthritis   . Basal cell carcinoma   . Chicken pox   . Headache   . Insomnia   . PONV (postoperative nausea and vomiting)   . Toe fracture, right 2012   rt great toe  . UTI (lower urinary tract infection)    Past Surgical History:  Procedure Laterality Date  . APPENDECTOMY  2005  . CESAREAN SECTION  [phone]  . LAPAROSCOPIC ABDOMINAL EXPLORATION     Social History   Socioeconomic History  . Marital status: Married    Spouse name: Not on file  . Number of children: Not on file  . Years of education: Not on file  . Highest education level: Not on file  Occupational History  . Not on file  Social Needs  . Financial resource strain: Not on file  . Food insecurity:    Worry: Not on file    Inability: Not on file  . Transportation needs:    Medical: Not on file    Non-medical: Not on file  Tobacco Use  . Smoking status: Never Smoker  . Smokeless  tobacco: Never Used  Substance and Sexual Activity  . Alcohol use: Yes    Alcohol/week: 8.0 standard drinks    Types: 8 Glasses of wine per week  . Drug use: No  . Sexual activity: Yes    Partners: Male    Comment: Married; husband has vasectomy.  Lifestyle  . Physical activity:    Days per week: Not on file    Minutes per session: Not on file  . Stress: Not on file  Relationships  . Social connections:    Talks on phone: Not on file    Gets together: Not on file    Attends religious service: Not on file    Active member of club or organization: Not on file    Attends meetings of clubs or organizations: Not on file    Relationship status: Not on file  Other Topics Concern  . Not on file  Social History Narrative   Married, [name]). 3 adult children [name], [name], [name]).   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    Takes daily vitamin   Wears seatbelt   exercises routinely.    Smoke detector in the home.    Feels safe in her relationships.    Allergies  Allergen Reactions  . Codeine Nausea  Only  . Vicodin [Hydrocodone-Acetaminophen] Nausea Only   Family History  Problem Relation Age of Onset  . Breast cancer Mother        79  . Dementia Mother   . Prostate cancer Father   . Colon polyps Father   . Diabetes Paternal Uncle   . Heart disease Paternal Uncle   . Early death Paternal Uncle   . Depression Maternal Grandmother   . Diabetes Maternal Grandfather   . Diabetes Paternal Grandfather   . Colon cancer Neg Hx   . Rectal cancer Neg Hx   . Stomach cancer Neg Hx          Current Outpatient Medications (Other):  .  clindamycin (CLEOCIN T) 1 % lotion, APP EXT AA BID .  PRESCRIPTION MEDICATION, Topical cream for skin cancer-? Name .  Vitamin D, Ergocalciferol, (DRISDOL) 50000 units CAPS capsule, Take 1 capsule (50,000 Units total) by mouth every 7 (seven) days.    Past medical history, social, surgical and family history  all reviewed in electronic medical record.  No pertanent information unless stated regarding to the chief complaint.   Review of Systems:  No headache, visual changes, nausea, vomiting, diarrhea, constipation, dizziness, abdominal pain, skin rash, fevers, chills, night sweats, weight loss, swollen lymph nodes,, chest pain, shortness of breath, mood changes.  Positive muscle aches, joint swelling, body aches  Objective  Blood pressure 108/70, pulse 67, height 5\' 6"  (1.676 m), SpO2 98 %.    General: No apparent distress alert and oriented x3 mood and affect normal, dressed appropriately.  HEENT: Pupils equal, extraocular movements intact  Respiratory: Patient's speak in full sentences and does not appear short of breath  Cardiovascular: No lower extremity edema, non tender, no erythema  Skin: Warm dry intact with no signs of infection or rash on extremities or on axial skeleton.  Abdomen: Soft nontender  Neuro: Cranial nerves II through XII are intact, neurovascularly intact in all extremities with 2+ DTRs and 2+ pulses.  Lymph: No lymphadenopathy of posterior or anterior cervical chain or axillae bilaterally.  Gait normal with good balance and coordination.  MSK: Diffuse tender with full range of motion and good stability and symmetric strength and tone of shoulders, elbows, wrist, hip, knee and ankles bilaterally.  Neck: Inspection loss of lordosis . No palpable stepoffs. Negative Spurling's maneuver. Loss of ROM in all planes by 5 degrees  Grip strength and sensation normal in bilateral hands Strength good C4 to T1 distribution No sensory change to C4 to T1 Negative Hoffman sign bilaterally Reflexes normal Tightness of the trapezius bilaterally    Impression and Recommendations:     . The above documentation has been reviewed and is accurate and complete Lyndal Pulley, DO       Note: This dictation was prepared with Dragon dictation along with smaller phrase technology.  Any transcriptional errors that result from this process are unintentional.

## 2018-10-24 DIAGNOSIS — L570 Actinic keratosis: Secondary | ICD-10-CM | POA: Diagnosis not present

## 2018-10-24 DIAGNOSIS — L700 Acne vulgaris: Secondary | ICD-10-CM | POA: Diagnosis not present

## 2018-10-24 DIAGNOSIS — D485 Neoplasm of uncertain behavior of skin: Secondary | ICD-10-CM | POA: Diagnosis not present

## 2018-10-24 NOTE — Progress Notes (Signed)
[facility] [place], [place] [address] Phone: [phone] Subjective:    I Michelle Ochoa am serving as a Education administrator for Dr. Hulan Saas.   I'm seeing this patient by the request  of:    CC: Fibromyalgia follow-up  [id number]  Michelle Ochoa is a 58 y.o. female coming in with complaint of fibromyalgia. State that she is still having numbness. Not a lot of change. Has an appointment with sleep study. Feels as if she needs to see a neurologist. Major symptoms is that she doesn't sleep well. Her numbness and based on certain positions. Exercises helps. Knuckle pain that causes her to move slower in order to avoid pain. Gaining weight while trying to lose it. States that she is retaining water.  Patient's MRI showed a very small protruding disc at C5-C6.  Continues to have the numbness is concerning.  Did not respond well to manipulation, has been noncompliant on some of the vitamin supplementations and has not been willing to do some of the other suggestions.  Patient is wondering what else can be done.  Wanting to know why this continues to happen though.      Past Medical History:  Diagnosis Date  . Anemia    with 1st childbirth  . Arthritis   . Basal cell carcinoma   . Chicken pox   . Headache   . Insomnia   . PONV (postoperative nausea and vomiting)   . Toe fracture, right 2012   rt great toe  . UTI (lower urinary tract infection)    Past Surgical History:  Procedure Laterality Date  . APPENDECTOMY  2005  . CESAREAN SECTION  [phone]  . LAPAROSCOPIC ABDOMINAL EXPLORATION     Social History   Socioeconomic History  . Marital status: Married    Spouse name: Not on file  . Number of children: Not on file  . Years of education: Not on file  . Highest education level: Not on file  Occupational History  . Not on file  Social Needs  . Financial resource strain: Not on file  . Food insecurity:    Worry: Not on file   Inability: Not on file  . Transportation needs:    Medical: Not on file    Non-medical: Not on file  Tobacco Use  . Smoking status: Never Smoker  . Smokeless tobacco: Never Used  Substance and Sexual Activity  . Alcohol use: Yes    Alcohol/week: 8.0 standard drinks    Types: 8 Glasses of wine per week  . Drug use: No  . Sexual activity: Yes    Partners: Male    Comment: Married; husband has vasectomy.  Lifestyle  . Physical activity:    Days per week: Not on file    Minutes per session: Not on file  . Stress: Not on file  Relationships  . Social connections:    Talks on phone: Not on file    Gets together: Not on file    Attends religious service: Not on file    Active member of club or organization: Not on file    Attends meetings of clubs or organizations: Not on file    Relationship status: Not on file  Other Topics Concern  . Not on file  Social History Narrative   Married, Michelle Ochoa). 3 adult children Michelle Ochoa, Michelle Ochoa, Michelle Ochoa).   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    Takes daily vitamin  Wears seatbelt   exercises routinely.    Smoke detector in the home.    Feels safe in her relationships.    Allergies  Allergen Reactions  . Codeine Nausea Only  . Vicodin [Hydrocodone-Acetaminophen] Nausea Only   Family History  Problem Relation Age of Onset  . Breast cancer Mother        51  . Dementia Mother   . Prostate cancer Father   . Colon polyps Father   . Diabetes Paternal Uncle   . Heart disease Paternal Uncle   . Early death Paternal Uncle   . Depression Maternal Grandmother   . Diabetes Maternal Grandfather   . Diabetes Paternal Grandfather   . Colon cancer Neg Hx   . Rectal cancer Neg Hx   . Stomach cancer Neg Hx          Current Outpatient Medications (Other):  .  clindamycin (CLEOCIN T) 1 % lotion, APP EXT AA BID .  PRESCRIPTION MEDICATION, Topical cream for skin cancer-? Name .  Vitamin D, Ergocalciferol,  (DRISDOL) 50000 units CAPS capsule, Take 1 capsule (50,000 Units total) by mouth every 7 (seven) days.    Past medical history, social, surgical and family history all reviewed in electronic medical record.  No pertanent information unless stated regarding to the chief complaint.   Review of Systems:  No headache, visual changes, nausea, vomiting, diarrhea, constipation, dizziness, abdominal pain, skin rash, fevers, chills, night sweats, weight loss, swollen lymph nodes, , chest pain, shortness of breath, mood changes.  Positive muscle aches, body aches  Objective  Blood pressure 128/82, pulse 76, height 5\' 6"  (1.676 m), weight 171 lb (77.6 kg), SpO2 98 %.   General: No apparent distress alert and oriented x3 mood and affect normal, dressed appropriately.  HEENT: Pupils equal, extraocular movements intact  Respiratory: Patient's speak in full sentences and does not appear short of breath  Cardiovascular: No lower extremity edema, non tender, no erythema  Skin: Warm dry intact with no signs of infection or rash on extremities or on axial skeleton.  Abdomen: Soft nontender  Neuro: Cranial nerves II through XII are intact, neurovascularly intact in all extremities with 2+ DTRs and 2+ pulses.  Lymph: No lymphadenopathy of posterior or anterior cervical chain or axillae bilaterally.  Gait normal with good balance and coordination.  MSK:  tender with full range of motion and good stability and symmetric strength and tone of shoulders, elbows, wrist, hip, knee and ankles bilaterally.  Neck exam still has some discomfort with Spurling's but no significant radicular symptoms.  Patient's grip strength is full.  Patient's neck has some very mild decrease in range of motion of 5 degrees of sidebending bilaterally but crepitus noted.  Upper back has multiple areas of tenderness that seems to be out of proportion to the amount of palpation.    Impression and Recommendations:     The above documentation  has been reviewed and is accurate and complete Michelle Pulley, DO       Note: This dictation was prepared with Dragon dictation along with smaller phrase technology. Any transcriptional errors that result from this process are unintentional.

## 2018-10-25 ENCOUNTER — Ambulatory Visit: Payer: BLUE CROSS/BLUE SHIELD | Admitting: Family Medicine

## 2018-10-25 ENCOUNTER — Encounter: Payer: Self-pay | Admitting: Family Medicine

## 2018-10-25 DIAGNOSIS — M5030 Other cervical disc degeneration, unspecified cervical region: Secondary | ICD-10-CM | POA: Diagnosis not present

## 2018-10-25 DIAGNOSIS — Z23 Encounter for immunization: Secondary | ICD-10-CM

## 2018-10-25 NOTE — Assessment & Plan Note (Signed)
Known degenerative disc disease.  With patient having some of the radicular symptoms in the hands especially at night with extension of the neck I am more concerned for nerve impingement and encourage patient to consider the possibility of an epidural injection which patient has declined.  We discussed formal physical therapy and patient will consider it but does not know if they will make a bigger difference.  Has been to physical therapy over the years for similar issues.  We discussed different medications including Effexor as well as Cymbalta that I think would be beneficial in this individual.  Patient though has been fairly noncompliant and is not interested in starting the medication at this time potentially.  We discussed the possibility of a neurology referral for nerve conduction study but I do not think it would tell us anything different than what she has had previously.  Patient did not respond well to manipulation.  We will continue to follow patient and can come back if she wants to make any changes in management Spent  25 minutes with patient face-to-face and had greater than 50% of counseling including as described above in assessment and plan.

## 2018-10-25 NOTE — Patient Instructions (Signed)
God to see you  I am sorry you are still having trouble work up so far has been good which is great  Once again consider the effexor  I can't wait to see what the sleep study shows We will hold on epidural  Write me if you would like PT and we can do dry needling there as well  Can have an appointment set up in 3 months if you want but please write me with questions

## 2018-10-27 ENCOUNTER — Encounter: Payer: Self-pay | Admitting: Family Medicine

## 2018-10-30 MED ORDER — VENLAFAXINE HCL ER 37.5 MG PO CP24: 37.5000 mg | ORAL_CAPSULE | Freq: Every day | ORAL | 1 refills | Status: DC

## 2018-11-08 ENCOUNTER — Encounter: Payer: Self-pay | Admitting: Neurology

## 2018-11-08 ENCOUNTER — Ambulatory Visit: Payer: BLUE CROSS/BLUE SHIELD | Admitting: Neurology

## 2018-11-08 VITALS — BP 132/78 | HR 68 | Ht 66.0 in | Wt 167.0 lb

## 2018-11-08 DIAGNOSIS — G479 Sleep disorder, unspecified: Secondary | ICD-10-CM

## 2018-11-08 DIAGNOSIS — G4700 Insomnia, unspecified: Secondary | ICD-10-CM

## 2018-11-08 DIAGNOSIS — R0683 Snoring: Secondary | ICD-10-CM | POA: Diagnosis not present

## 2018-11-08 DIAGNOSIS — R5383 Other fatigue: Secondary | ICD-10-CM | POA: Diagnosis not present

## 2018-11-08 DIAGNOSIS — R351 Nocturia: Secondary | ICD-10-CM

## 2018-11-08 NOTE — Patient Instructions (Addendum)
Based on your symptoms and your exam I believe we should look for an underlying organic sleep disorder, such as obstructive sleep apnea and proceed with a sleep study.    If you have more than mild OSA, I want you to consider treatment with CPAP. Please remember, the risks and ramifications of moderate to severe obstructive sleep apnea or OSA are: Cardiovascular disease, including congestive heart failure, stroke, difficult to control hypertension, arrhythmias, and even type 2 diabetes has been linked to untreated OSA. Sleep apnea causes disruption of sleep and sleep deprivation in most cases, which, in turn, can cause recurrent headaches, problems with memory, mood, concentration, focus, and vigilance. Most people with untreated sleep apnea report excessive daytime sleepiness, which can affect their ability to drive. Please do not drive if you feel sleepy.   The sleep lab administrative assistant will call you to schedule your sleep study. If you don't hear back from her by about 2 weeks from now, please feel free to call her at [phone]. This is her direct line and please leave a message with your phone number to call back if you get the voicemail box. She will call back as soon as possible.   We will also call you with the sleep study results.  Please remember to try to maintain good sleep hygiene, which means: Keep a regular sleep and wake schedule, try not to exercise or have a meal within 2 hours of your bedtime, try to keep your bedroom conducive for sleep, that is, cool and dark, without light distractors such as an illuminated alarm clock, and refrain from watching TV right before sleep or in the middle of the night and do not keep the TV or radio on during the night. Also, try not to use or play on electronic devices at bedtime, such as your cell phone, tablet PC or laptop. If you like to read at bedtime on an electronic device, try to dim the background light as much as possible. Do not eat in  the middle of the night.    If you want to take Melatonin at night for sleep: take 1 mg to 3 mg, one to 2 hours before your bedtime. You can go up to 5 mg if needed.

## 2018-11-08 NOTE — Progress Notes (Signed)
Subjective:    Patient ID: Michelle Ochoa is a 58 y.o. female.  HPI     Star Age, MD, PhD [facility] [address], [address] [place], [place] [address]  Dear Dr. Tamala Julian,  I saw your patient, Michelle Ochoa, upon your kind request in my sleep clinic today for initial consultation of her sleep disorder, in particular, concern for concern for daytime tiredness. The patient is unaccompanied today. As you know, [name] is a 58 year old right-handed woman with an underlying medical history of neck pain, anemia, arthritis, basal cell cancer, and overweight state, who reports snoring and difficulty maintaining sleep, some daytime tiredness reported. I reviewed your office note from 09/06/2018 as well as 10/25/2018. She reports nonrestorative sleep, she has difficulty maintaining sleep. Epworth Sleepiness Scale score is 7 out of 24, fatigue score is 30 out of 63. She lives with her husband and her daughter. She is self-employed. She has 3 children. She is a nonsmoker and drinks wine about 1 or 2 per day, nearly daily, caffeine in the form of coffee, 1-2 per day in the morning only typically. She has recently started Effexor. She also started a keto diet. She has a variable sleep schedule, in bed between 9:30 to 11 PM. Rise time around 7:30. She does not have much in the way of difficulty falling asleep but does have difficulty staying asleep. She has had difficulty maintaining sleep for years. She reports that her mother has a history of difficulty maintaining sleep. She has a family history of sleep apnea in her father. She does not typically have morning headaches and has nocturia about once per average night. She snores mildly per husband. She has had some weight gain slowly over the past 5 years in the realm of 15 pounds. She has been taking melatonin but takes it in the middle of the night when she wakes up between 1 and 3 AM typically. She has had hand numbness  particularly at night. Her preferred sleep position used to be on her stomach with one overextended but she would wake up with significant numbness in the entire arm and she now sleeps primarily on her sides. She has been using wrist braces at night but not consistently.  Her Past Medical History Is Significant For: Past Medical History:  Diagnosis Date  . Anemia    with 1st childbirth  . Arthritis   . Basal cell carcinoma   . Chicken pox   . Headache   . Insomnia   . PONV (postoperative nausea and vomiting)   . Toe fracture, right 2012   rt great toe  . UTI (lower urinary tract infection)     Her Past Surgical History Is Significant For: Past Surgical History:  Procedure Laterality Date  . APPENDECTOMY  2005  . CESAREAN SECTION  [phone]  . LAPAROSCOPIC ABDOMINAL EXPLORATION      Her Family History Is Significant For: Family History  Problem Relation Age of Onset  . Breast cancer Mother        71  . Dementia Mother   . Prostate cancer Father   . Colon polyps Father   . Diabetes Paternal Uncle   . Heart disease Paternal Uncle   . Early death Paternal Uncle   . Depression Maternal Grandmother   . Diabetes Maternal Grandfather   . Diabetes Paternal Grandfather   . Colon cancer Neg Hx   . Rectal cancer Neg Hx   . Stomach cancer Neg Hx  Her Social History Is Significant For: Social History   Socioeconomic History  . Marital status: Married    Spouse name: Not on file  . Number of children: Not on file  . Years of education: Not on file  . Highest education level: Not on file  Occupational History  . Not on file  Social Needs  . Financial resource strain: Not on file  . Food insecurity:    Worry: Not on file    Inability: Not on file  . Transportation needs:    Medical: Not on file    Non-medical: Not on file  Tobacco Use  . Smoking status: Never Smoker  . Smokeless tobacco: Never Used  Substance and Sexual Activity  . Alcohol use: Yes     Alcohol/week: 8.0 standard drinks    Types: 8 Glasses of wine per week  . Drug use: No  . Sexual activity: Yes    Partners: Male    Comment: Married; husband has vasectomy.  Lifestyle  . Physical activity:    Days per week: Not on file    Minutes per session: Not on file  . Stress: Not on file  Relationships  . Social connections:    Talks on phone: Not on file    Gets together: Not on file    Attends religious service: Not on file    Active member of club or organization: Not on file    Attends meetings of clubs or organizations: Not on file    Relationship status: Not on file  Other Topics Concern  . Not on file  Social History Narrative   Married, Michelle Ochoa). 3 adult children Michelle Ochoa, Michelle, Stotts Ochoa).   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    Takes daily vitamin   Wears seatbelt   exercises routinely.    Smoke detector in the home.    Feels safe in her relationships.     Her Allergies Are:  Allergies  Allergen Reactions  . Codeine Nausea Only  . Vicodin [Hydrocodone-Acetaminophen] Nausea Only  :   Her Current Medications Are:  Outpatient Encounter Medications as of 11/08/2018  Medication Sig  . PRESCRIPTION MEDICATION Topical cream for skin cancer-? Name  . venlafaxine XR (EFFEXOR XR) 37.5 MG 24 hr capsule Take 1 capsule (37.5 mg total) by mouth daily with breakfast.  . [DISCONTINUED] clindamycin (CLEOCIN T) 1 % lotion APP EXT AA BID  . [DISCONTINUED] Vitamin D, Ergocalciferol, (DRISDOL) 50000 units CAPS capsule Take 1 capsule (50,000 Units total) by mouth every 7 (seven) days.   No facility-administered encounter medications on file as of 11/08/2018.   :  Review of Systems:  Out of a complete 14 point review of systems, all are reviewed and negative with the exception of these symptoms as listed below:  Review of Systems  Neurological:       Pt presents today to discuss her sleep. Pt has never had a sleep study but does endorse  snoring.  Epworth Sleepiness Scale 0= would never doze 1= slight chance of dozing 2= moderate chance of dozing 3= high chance of dozing  Sitting and reading: 2 Watching TV: 1 Sitting inactive in a public place (ex. Theater or meeting): 1 As a passenger in a car for an hour without a break: 0 Lying down to rest in the afternoon: 2 Sitting and talking to someone: 0 Sitting quietly after lunch (no alcohol): 1 In a car, while stopped in traffic: 0 Total: 7  Objective:  Neurological Exam  Physical Exam Physical Examination:   Vitals:   11/08/18 1118  BP: 132/78  Pulse: 68   General Examination: The patient is a very pleasant 58 y.o. female in no acute distress. She appears well-developed and well-nourished and well groomed.   HEENT: Normocephalic, atraumatic, pupils are equal, round and reactive to light and accommodation. Corrective eye glasses. Extraocular tracking is good without limitation to gaze excursion or nystagmus noted. Normal smooth pursuit is noted. Hearing is grossly intact. Face is symmetric with normal facial animation and normal facial sensation. Speech is clear with no dysarthria noted. There is no hypophonia. There is no lip, neck/head, jaw or voice tremor. Neck is supple with full range of passive and active motion. There are no carotid bruits on auscultation. Oropharynx exam reveals: mild mouth dryness, adequate dental hygiene and mild airway crowding, due to smaller airway entry. Mallampati is class I. Neck circumference is 14-7/8 inches. Tongue protrudes centrally and palate elevates symmetrically, tonsils are small. Scar noted right side of the face including right side of the nose. She had skin cancer surgery.  Chest: Clear to auscultation without wheezing, rhonchi or crackles noted.  Heart: S1+S2+0, regular and normal without murmurs, rubs or gallops noted.   Abdomen: Soft, non-tender and non-distended with normal bowel sounds appreciated on  auscultation.  Extremities: There is no edema in the distal lower extremities bilaterally.   Skin: Warm and dry without trophic changes noted.  Musculoskeletal: exam reveals no obvious joint deformities, tenderness or joint swelling or erythema.   Neurologically:  Mental status: The patient is awake, alert and oriented in all 4 spheres. Her immediate and remote memory, attention, language skills and fund of knowledge are appropriate. There is no evidence of aphasia, agnosia, apraxia or anomia. Speech is clear with normal prosody and enunciation. Thought process is linear. Mood is normal and affect is normal.  Cranial nerves II - XII are as described above under HEENT exam. In addition: shoulder shrug is normal with equal shoulder height noted. Motor exam: Normal bulk, normal global movements. There is no tremor. Fine motor skills and coordination: grossly intact.  Cerebellar testing: No dysmetria or intention tremor. There is no truncal or gait ataxia.  Sensory exam: intact to light touch.  Gait, station and balance: She stands easily. No veering to one side is noted. No leaning to one side is noted. Posture is age-appropriate and stance is narrow based. Gait shows normal stride length and normal pace. No problems turning are noted.               Assessment and Plan:  In summary, ALEXIS REBER is a very pleasant 58 y.o.-year old female wwith an underlying medical history of neck pain, anemia, arthritis, basal cell cancer, and overweight state, who presents for evaluation of her sleep disturbance in particular difficulty maintaining sleep. We talked about the importance of sleep hygiene and how a set bedtime and rise time schedule may help. Furthermore, she is advised that should she want to take melatonin she should try to take it ahead of her bedtime, not so much in the middle of the night. Furthermore, she is cautioned about the sleep disruptive effects of alcohol. Her history and exam are not  telltale for obstructive sleep apnea, less, I suggested we proceed with a sleep study to rule out an underlying organic cause of her sleep disturbance. She was in agreement. To that end, we will proceed with hopefully a laboratory attended sleep study.  We will call her with her test results and I plan to see her back after her studies completed. I explained the sleep test procedure to the patient and also outlined possible surgical and non-surgical treatment options of OSA, including the use CPAP. She indicated, she would be willing to try CPAP if the need arises.  I answered all her questions today and the patient was in agreement.  Thank you very much for allowing me to participate in the care of this nice patient. If I can be of any further assistance to you please do not hesitate to call me at [phone].  Sincerely,   Star Age, MD, PhD

## 2018-11-29 ENCOUNTER — Telehealth: Payer: Self-pay | Admitting: Family Medicine

## 2018-11-29 MED ORDER — VENLAFAXINE HCL ER 37.5 MG PO CP24: 37.5000 mg | ORAL_CAPSULE | Freq: Every day | ORAL | 0 refills | Status: DC

## 2018-11-29 NOTE — Telephone Encounter (Signed)
rx sent into pharmacy

## 2018-11-29 NOTE — Telephone Encounter (Signed)
Copied from [place]. Topic: Quick Communication - Rx Refill/Question >> Nov 29, 2018  9:23 AM Margot Ables wrote: Medication: venlafaxine XR (EFFEXOR XR) 37.5 MG 24 hr capsule  - pt insurance requiring 90 day supply on this medication or pt has substantial cost difference out of pocket. Pt has 1 dose left and leaving town 12/01/2018.  Has the patient contacted their pharmacy? Yes - new RX needed 90 day supply Preferred Pharmacy (with phone number or street name): [facility] [place], [place] - [address] [place] [address] [place] [address] [phone] (Phone) [phone] (Fax)

## 2019-02-12 DIAGNOSIS — F4322 Adjustment disorder with anxiety: Secondary | ICD-10-CM | POA: Diagnosis not present

## 2019-03-15 DIAGNOSIS — F4322 Adjustment disorder with anxiety: Secondary | ICD-10-CM | POA: Diagnosis not present

## 2019-03-26 DIAGNOSIS — F4322 Adjustment disorder with anxiety: Secondary | ICD-10-CM | POA: Diagnosis not present

## 2019-05-24 DIAGNOSIS — L821 Other seborrheic keratosis: Secondary | ICD-10-CM | POA: Diagnosis not present

## 2019-05-24 DIAGNOSIS — Z85828 Personal history of other malignant neoplasm of skin: Secondary | ICD-10-CM | POA: Diagnosis not present

## 2019-05-24 DIAGNOSIS — D485 Neoplasm of uncertain behavior of skin: Secondary | ICD-10-CM | POA: Diagnosis not present

## 2019-05-24 DIAGNOSIS — L570 Actinic keratosis: Secondary | ICD-10-CM | POA: Diagnosis not present

## 2019-05-24 DIAGNOSIS — L814 Other melanin hyperpigmentation: Secondary | ICD-10-CM | POA: Diagnosis not present

## 2019-05-24 DIAGNOSIS — D225 Melanocytic nevi of trunk: Secondary | ICD-10-CM | POA: Diagnosis not present

## 2019-06-19 IMAGING — DX DG HAND COMPLETE 3+V*R*
3 series · 3 of 3 positions shown · non-contrast
Comparison: None available

CLINICAL DATA: Injury, pain

EXAM:
RIGHT HAND - COMPLETE 3+ VIEW

[hand ap]
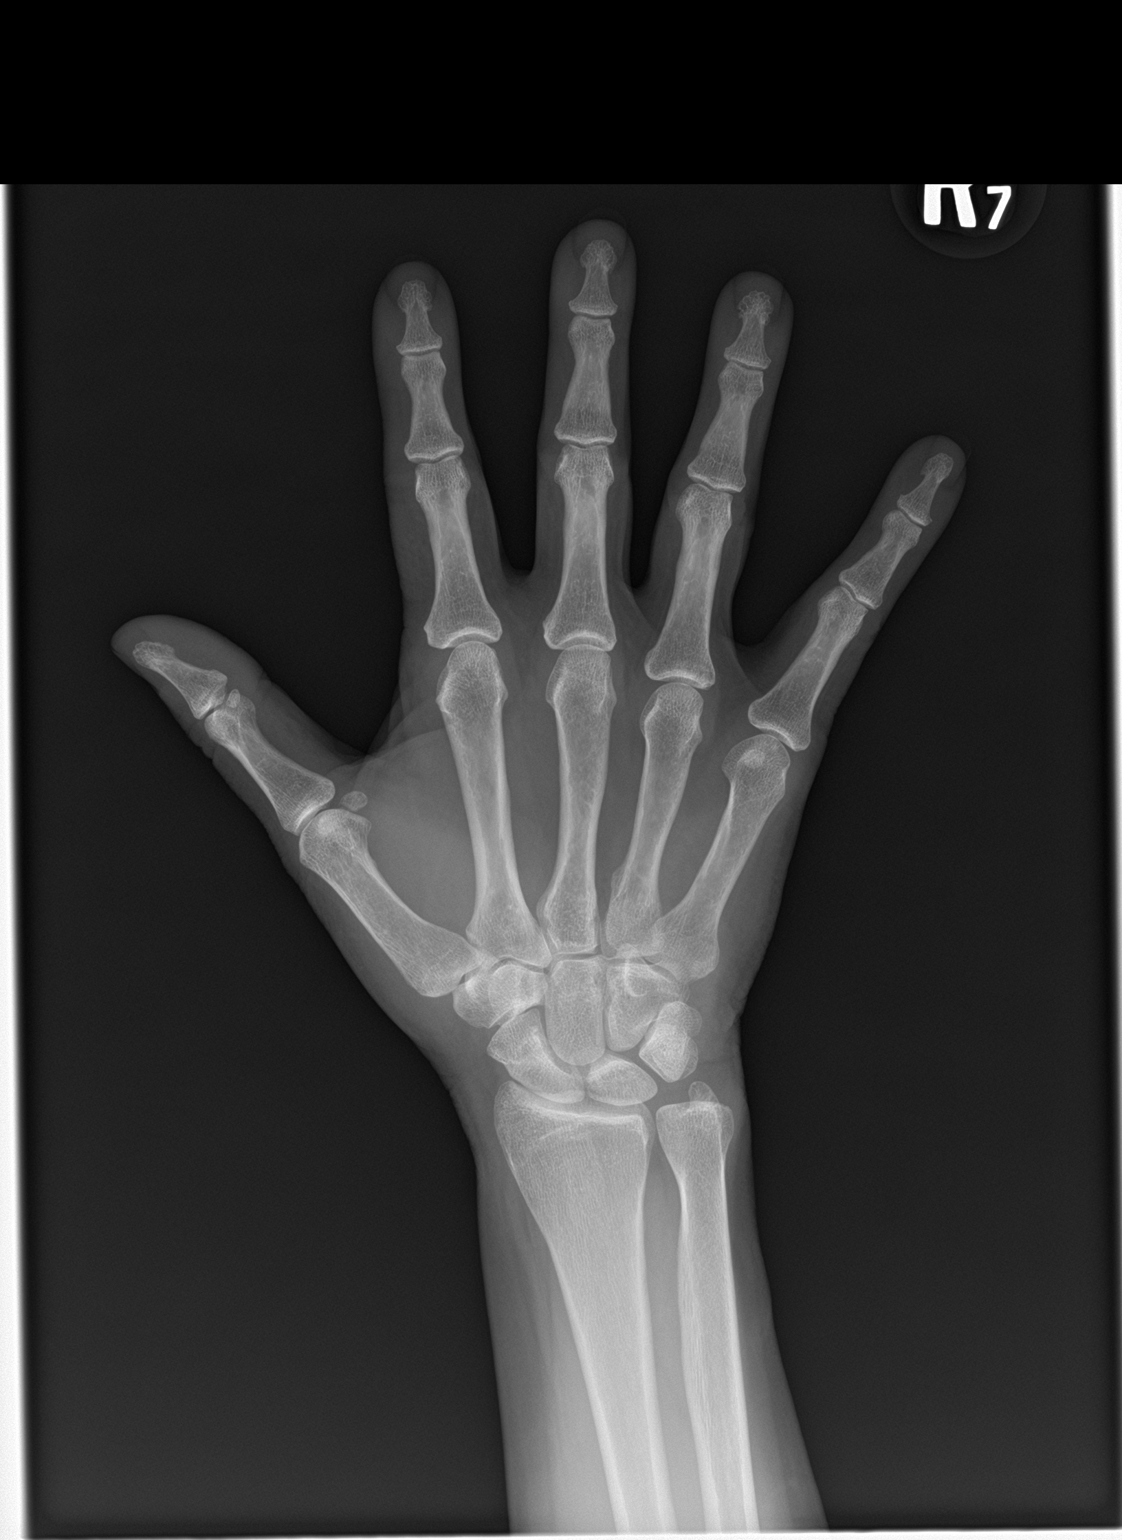

[hand obl]
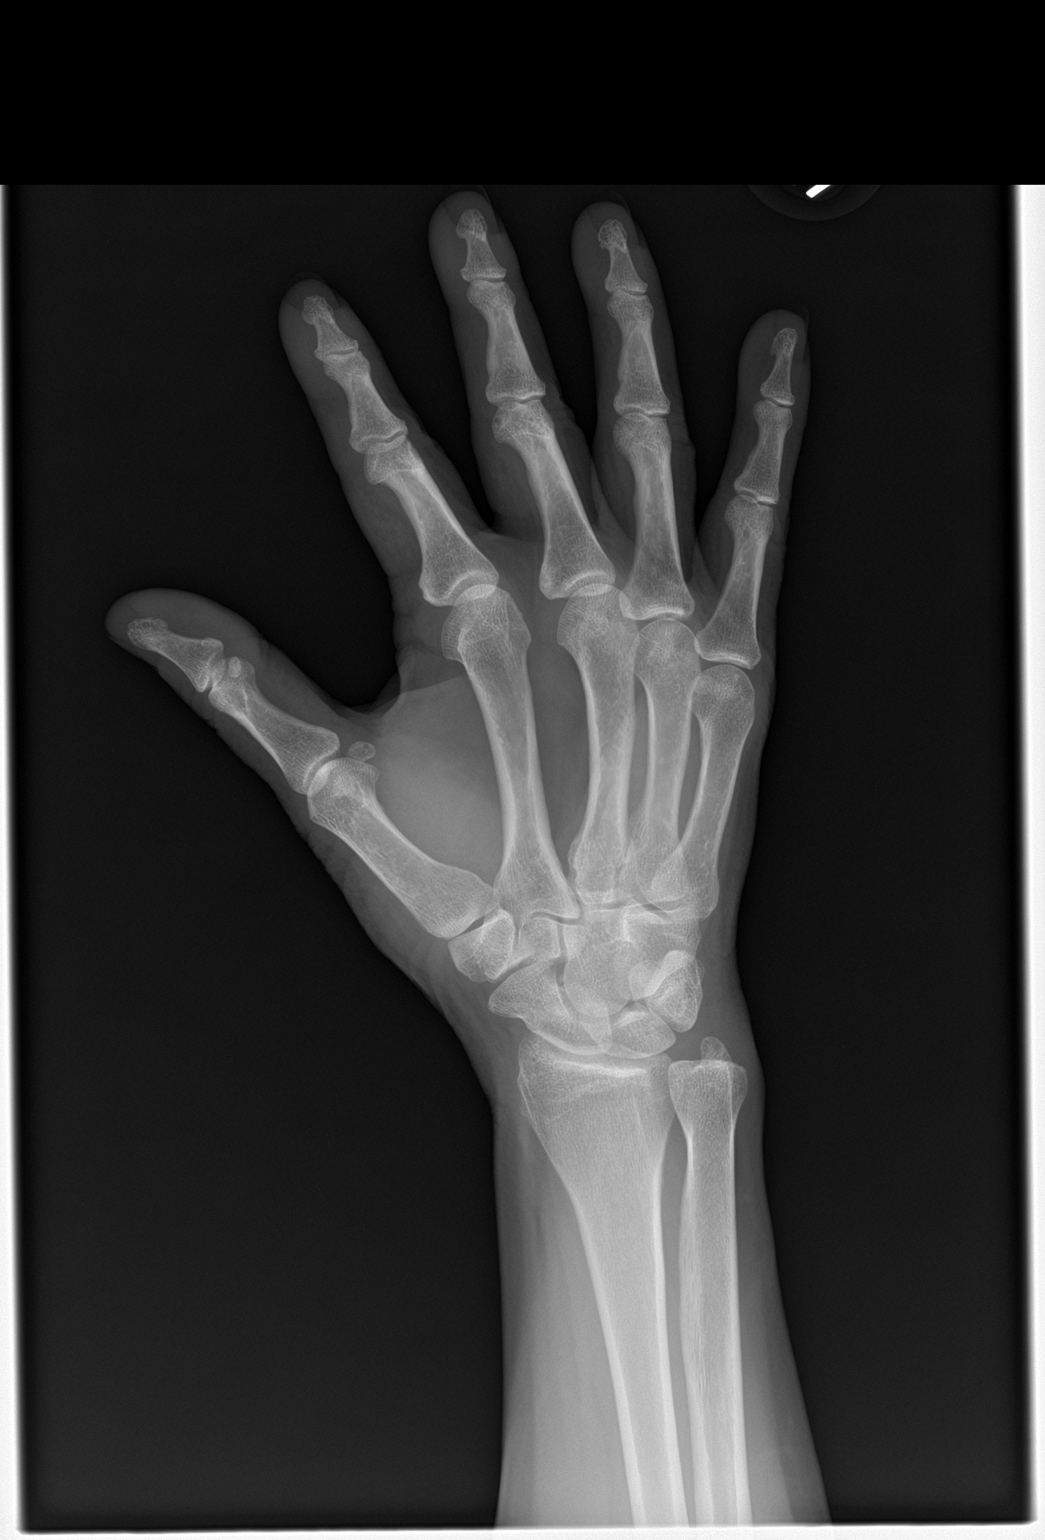

[hand lat]
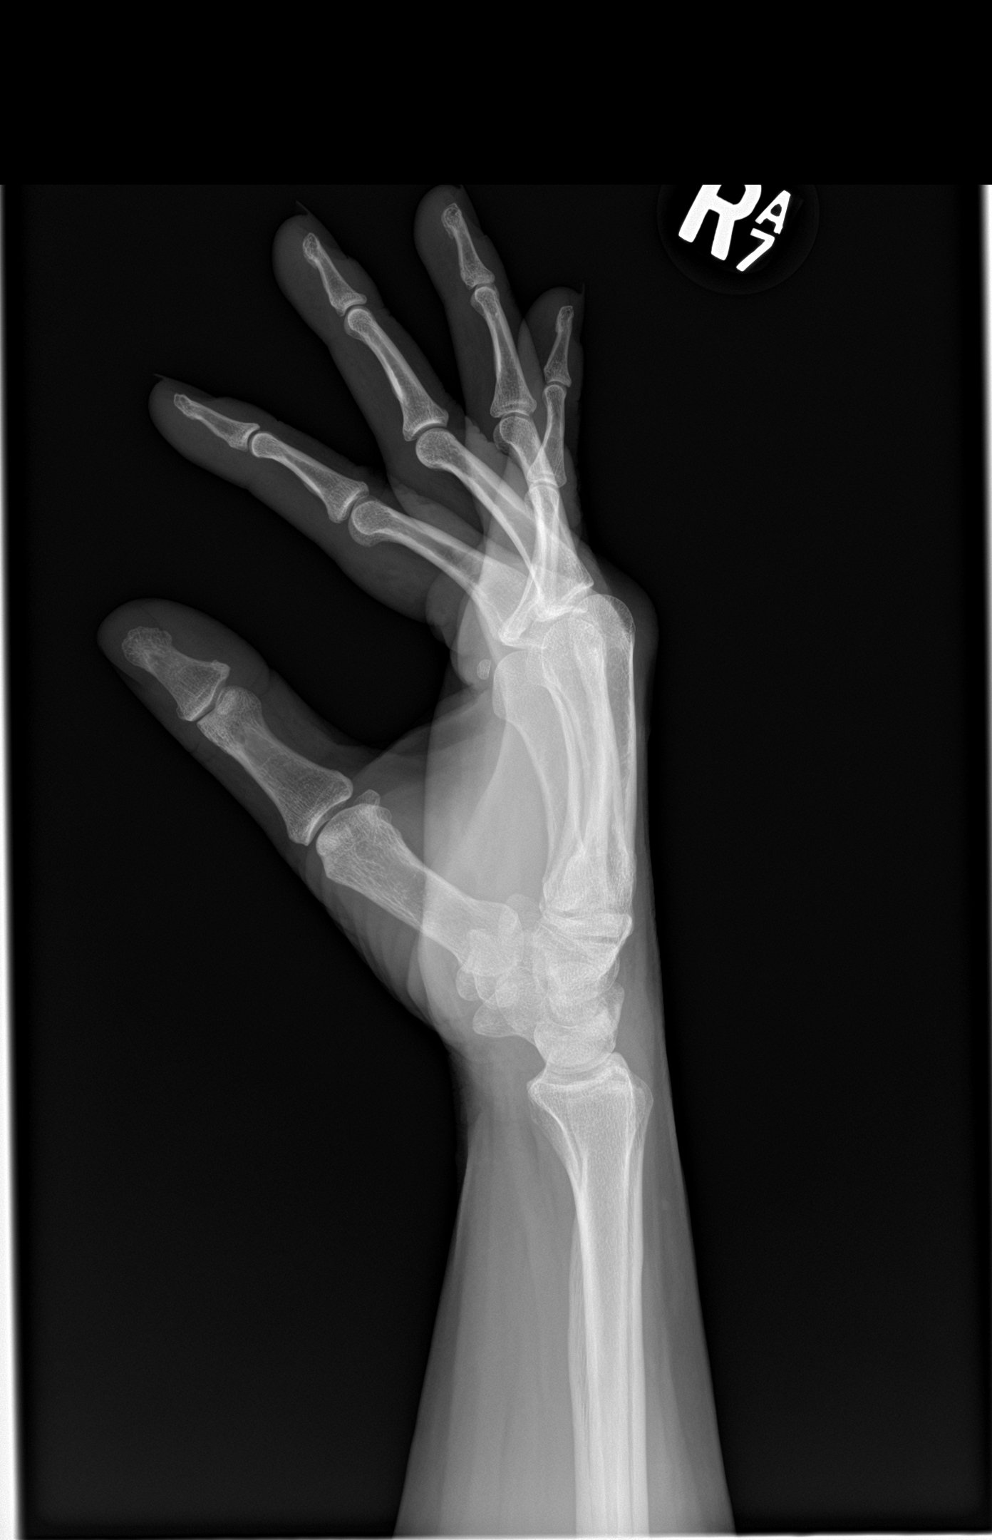

[3 of 3 positions shown; findings below may reference images not displayed]

FINDINGS: There is no evidence of fracture or dislocation. There is no
evidence of arthropathy or other focal bone abnormality. Soft
tissues are unremarkable.
IMPRESSION: Negative.

## 2019-07-17 DIAGNOSIS — Z20828 Contact with and (suspected) exposure to other viral communicable diseases: Secondary | ICD-10-CM | POA: Diagnosis not present

## 2019-08-16 DIAGNOSIS — R609 Edema, unspecified: Secondary | ICD-10-CM | POA: Insufficient documentation

## 2019-08-31 DIAGNOSIS — Z20828 Contact with and (suspected) exposure to other viral communicable diseases: Secondary | ICD-10-CM | POA: Diagnosis not present

## 2019-09-27 DIAGNOSIS — Z20828 Contact with and (suspected) exposure to other viral communicable diseases: Secondary | ICD-10-CM | POA: Diagnosis not present

## 2019-11-18 DIAGNOSIS — Z23 Encounter for immunization: Secondary | ICD-10-CM | POA: Diagnosis not present

## 2019-12-16 DIAGNOSIS — Z23 Encounter for immunization: Secondary | ICD-10-CM | POA: Diagnosis not present

## 2019-12-20 DIAGNOSIS — H40013 Open angle with borderline findings, low risk, bilateral: Secondary | ICD-10-CM | POA: Diagnosis not present

## 2020-01-03 DIAGNOSIS — H401310 Pigmentary glaucoma, right eye, stage unspecified: Secondary | ICD-10-CM | POA: Diagnosis not present

## 2020-02-04 ENCOUNTER — Ambulatory Visit (INDEPENDENT_AMBULATORY_CARE_PROVIDER_SITE_OTHER): Payer: BC Managed Care – PPO | Admitting: Family Medicine

## 2020-02-04 ENCOUNTER — Encounter: Payer: Self-pay | Admitting: Family Medicine

## 2020-02-04 ENCOUNTER — Other Ambulatory Visit: Payer: Self-pay

## 2020-02-04 VITALS — BP 108/72 | HR 67 | Temp 97.5°F | Resp 18 | Ht 65.8 in | Wt 172.0 lb

## 2020-02-04 DIAGNOSIS — Z130 Encounter for screening for diseases of the blood and blood-forming organs and certain disorders involving the immune mechanism: Secondary | ICD-10-CM | POA: Diagnosis not present

## 2020-02-04 DIAGNOSIS — E559 Vitamin D deficiency, unspecified: Secondary | ICD-10-CM

## 2020-02-04 DIAGNOSIS — Z0000 Encounter for general adult medical examination without abnormal findings: Secondary | ICD-10-CM

## 2020-02-04 DIAGNOSIS — R202 Paresthesia of skin: Secondary | ICD-10-CM | POA: Diagnosis not present

## 2020-02-04 DIAGNOSIS — Z803 Family history of malignant neoplasm of breast: Secondary | ICD-10-CM

## 2020-02-04 DIAGNOSIS — Z1159 Encounter for screening for other viral diseases: Secondary | ICD-10-CM

## 2020-02-04 DIAGNOSIS — E663 Overweight: Secondary | ICD-10-CM

## 2020-02-04 DIAGNOSIS — M5030 Other cervical disc degeneration, unspecified cervical region: Secondary | ICD-10-CM

## 2020-02-04 DIAGNOSIS — R7309 Other abnormal glucose: Secondary | ICD-10-CM | POA: Diagnosis not present

## 2020-02-04 DIAGNOSIS — R5383 Other fatigue: Secondary | ICD-10-CM | POA: Diagnosis not present

## 2020-02-04 DIAGNOSIS — R200 Anesthesia of skin: Secondary | ICD-10-CM | POA: Diagnosis not present

## 2020-02-04 LAB — COMPREHENSIVE METABOLIC PANEL
ALT: 32 U/L (ref 0–35)
AST: 23 U/L (ref 0–37)
Albumin: 4.5 g/dL (ref 3.5–5.2)
Alkaline Phosphatase: 81 U/L (ref 39–117)
BUN: 11 mg/dL (ref 6–23)
CO2: 28 mEq/L (ref 19–32)
Calcium: 9.8 mg/dL (ref 8.4–10.5)
Chloride: 102 mEq/L (ref 96–112)
Creatinine, Ser: 0.75 mg/dL (ref 0.40–1.20)
GFR: 79.79 mL/min (ref >60.00)
Glucose, Bld: 96 mg/dL (ref 70–99)
Potassium: 4.5 mEq/L (ref 3.5–5.1)
Sodium: 137 mEq/L (ref 135–145)
Total Bilirubin: 0.9 mg/dL (ref 0.2–1.2)
Total Protein: 7.1 g/dL (ref 6.0–8.3)

## 2020-02-04 LAB — LIPID PANEL
Cholesterol: 264 mg/dL — ABNORMAL HIGH (ref 0–200)
HDL: 57.9 mg/dL (ref >39.00)
LDL Cholesterol: 181 mg/dL — ABNORMAL HIGH (ref 0–99)
NonHDL: 206.33
Total CHOL/HDL Ratio: 5
Triglycerides: 129 mg/dL (ref 0.0–149.0)
VLDL: 25.8 mg/dL (ref 0.0–40.0)

## 2020-02-04 LAB — CBC
HCT: 41.3 % (ref 36.0–46.0)
Hemoglobin: 13.8 g/dL (ref 12.0–15.0)
MCHC: 33.5 g/dL (ref 30.0–36.0)
MCV: 87.9 fl (ref 78.0–100.0)
Platelets: 198 10*3/uL (ref 150.0–400.0)
RBC: 4.7 Mil/uL (ref 3.87–5.11)
RDW: 13.6 % (ref 11.5–15.5)
WBC: 4.9 10*3/uL (ref 4.0–10.5)

## 2020-02-04 LAB — HEMOGLOBIN A1C: Hgb A1c MFr Bld: 5.6 % (ref 4.6–6.5)

## 2020-02-04 LAB — VITAMIN D 25 HYDROXY (VIT D DEFICIENCY, FRACTURES): VITD: 25.93 ng/mL — ABNORMAL LOW (ref 30.00–100.00)

## 2020-02-04 LAB — TSH: TSH: 1.42 u[IU]/mL (ref 0.35–4.50)

## 2020-02-04 NOTE — Patient Instructions (Signed)
Nice to see you today.  We will call you with your labs.   I have referred you to neurologist.    Health Maintenance, Female Adopting a healthy lifestyle and getting preventive care are important in promoting health and wellness. Ask your health care provider about:  The right schedule for you to have regular tests and exams.  Things you can do on your own to prevent diseases and keep yourself healthy. What should I know about diet, weight, and exercise? Eat a healthy diet   Eat a diet that includes plenty of vegetables, fruits, low-fat dairy products, and lean protein.  Do not eat a lot of foods that are high in solid fats, added sugars, or sodium. Maintain a healthy weight Body mass index (BMI) is used to identify weight problems. It estimates body fat based on height and weight. Your health care provider can help determine your BMI and help you achieve or maintain a healthy weight. Get regular exercise Get regular exercise. This is one of the most important things you can do for your health. Most adults should:  Exercise for at least 150 minutes each week. The exercise should increase your heart rate and make you sweat (moderate-intensity exercise).  Do strengthening exercises at least twice a week. This is in addition to the moderate-intensity exercise.  Spend less time sitting. Even light physical activity can be beneficial. Watch cholesterol and blood lipids Have your blood tested for lipids and cholesterol at 57 years of age, then have this test every 5 years. Have your cholesterol levels checked more often if:  Your lipid or cholesterol levels are high.  You are older than 57 years of age.  You are at high risk for heart disease. What should I know about cancer screening? Depending on your health history and family history, you may need to have cancer screening at various ages. This may include screening for:  Breast cancer.  Cervical cancer.  Colorectal  cancer.  Skin cancer.  Lung cancer. What should I know about heart disease, diabetes, and high blood pressure? Blood pressure and heart disease  High blood pressure causes heart disease and increases the risk of stroke. This is more likely to develop in people who have high blood pressure readings, are of African descent, or are overweight.  Have your blood pressure checked: ? Every 3-5 years if you are 1-63 years of age. ? Every year if you are 82 years old or older. Diabetes Have regular diabetes screenings. This checks your fasting blood sugar level. Have the screening done:  Once every three years after age 19 if you are at a normal weight and have a low risk for diabetes.  More often and at a younger age if you are overweight or have a high risk for diabetes. What should I know about preventing infection? Hepatitis B If you have a higher risk for hepatitis B, you should be screened for this virus. Talk with your health care provider to find out if you are at risk for hepatitis B infection. Hepatitis C Testing is recommended for:  Everyone born from [birth year] through 1963.  Anyone with known risk factors for hepatitis C. Sexually transmitted infections (STIs)  Get screened for STIs, including gonorrhea and chlamydia, if: ? You are sexually active and are younger than 56 years of age. ? You are older than 57 years of age and your health care provider tells you that you are at risk for this type of infection. ? Your sexual  activity has changed since you were last screened, and you are at increased risk for chlamydia or gonorrhea. Ask your health care provider if you are at risk.  Ask your health care provider about whether you are at high risk for HIV. Your health care provider may recommend a prescription medicine to help prevent HIV infection. If you choose to take medicine to prevent HIV, you should first get tested for HIV. You should then be tested every 3 months for as long as  you are taking the medicine. Pregnancy  If you are about to stop having your period (premenopausal) and you may become pregnant, seek counseling before you get pregnant.  Take 400 to 800 micrograms (mcg) of folic acid every day if you become pregnant.  Ask for birth control (contraception) if you want to prevent pregnancy. Osteoporosis and menopause Osteoporosis is a disease in which the bones lose minerals and strength with aging. This can result in bone fractures. If you are 24 years old or older, or if you are at risk for osteoporosis and fractures, ask your health care provider if you should:  Be screened for bone loss.  Take a calcium or vitamin D supplement to lower your risk of fractures.  Be given hormone replacement therapy (HRT) to treat symptoms of menopause. Follow these instructions at home: Lifestyle  Do not use any products that contain nicotine or tobacco, such as cigarettes, e-cigarettes, and chewing tobacco. If you need help quitting, ask your health care provider.  Do not use street drugs.  Do not share needles.  Ask your health care provider for help if you need support or information about quitting drugs. Alcohol use  Do not drink alcohol if: ? Your health care provider tells you not to drink. ? You are pregnant, may be pregnant, or are planning to become pregnant.  If you drink alcohol: ? Limit how much you use to 0-1 drink a day. ? Limit intake if you are breastfeeding.  Be aware of how much alcohol is in your drink. In the [place] one drink equals one 12 oz bottle of beer (355 mL), one 5 oz glass of wine (148 mL), or one 1 oz glass of hard liquor (44 mL). General instructions  Schedule regular health, dental, and eye exams.  Stay current with your vaccines.  Tell your health care provider if: ? You often feel depressed. ? You have ever been abused or do not feel safe at home. Summary  Adopting a healthy lifestyle and getting preventive care are  important in promoting health and wellness.  Follow your health care provider's instructions about healthy diet, exercising, and getting tested or screened for diseases.  Follow your health care provider's instructions on monitoring your cholesterol and blood pressure. This information is not intended to replace advice given to you by your health care provider. Make sure you discuss any questions you have with your health care provider. Document Revised: 09/27/2018 Document Reviewed: 09/27/2018 Elsevier Patient Education  2020 [employer].

## 2020-02-04 NOTE — Progress Notes (Signed)
This visit occurred during the SARS-CoV-2 public health emergency.  Safety protocols were in place, including screening questions prior to the visit, additional usage of staff PPE, and extensive cleaning of exam room while observing appropriate contact time as indicated for disinfecting solutions.    Patient ID: Michelle Ochoa, female  DOB: Dec 09, 1960, 59 y.o.   MRN: [id number] Patient Care Team    Relationship Specialty Notifications Start End  Ma Hillock, DO PCP - General Family Medicine  05/20/16   Dian Queen, MD Consulting Physician Obstetrics and Gynecology  05/20/16   Doran Stabler, MD Consulting Physician Gastroenterology  02/04/20   Lyndal Pulley, [place] Physician Sports Medicine  02/04/20   Star Age, MD Attending Physician Neurology  02/04/20     Chief Complaint  Patient presents with  . Annual Exam    Not fasting. Mammogram 2019. Pap smear 2019. Pt took Vit B12 injection last week    Subjective:  Michelle Ochoa is a 59 y.o.  Female  present for CPE. All past medical history, surgical history, allergies, family history, immunizations, medications and social history were updated in the electronic medical record today. All recent labs, ED visits and hospitalizations within the last year were reviewed.  Health maintenance:  Colonoscopy: completed 10/2017, Dr. Loletha Carrow- normal- 10 yr rpt.  Mammogram: completed: 2019. Family history in her mother at age 40. Would advise yearly screening. Patient follows with gynecology for her mammograms. Cervical cancer screening: 2019 per patient, [place] gynecologist. Immunizations: tdap UTD 2017, Influenza UTD 2020(encouraged yearly).  Covid Moderna vaccine-series completed. Infectious disease screening:  HIV screening declined, hepatitis C screening completed today DEXA: normal, 2007 Assistive device: none Oxygen [id number] Patient has a Dental home. Hospitalizations/ED visits: reviewed  Patient states that she  still having some fatigue.  She is concerned about her thyroid.  She is still having numbness and tingling in her bilateral upper extremities.  She has been seen by neurology for this condition.  She states that they think she has thoracic outlet syndrome.  She admits, she has not been stretching or performing exercises for her thoracic outlet syndrome recently.  She states she is still having numbness and tingling and feels that it is worsening.  She would like a second opinion on the diagnosis and would like to be referred to a neurologist at [facility].   Depression screen [facility] 2/9 02/04/2020 11/02/2017 05/25/2016  Decreased Interest 0 0 0  Down, Depressed, Hopeless 0 0 0  PHQ - 2 Score 0 0 0   No flowsheet data found.   Immunization History  Administered Date(s) Administered  . Influenza,inj,Quad PF,6+ Mos 11/02/2017, 10/25/2018  . Influenza-Unspecified 07/18/2016  . Moderna SARS-COVID-2 Vaccination 11/18/2019, 12/16/2019  . Tdap 06/08/2016    Past Medical History:  Diagnosis Date  . Anemia    with 1st childbirth  . Arthritis   . Basal cell carcinoma   . Chicken pox   . Headache   . Insomnia   . PONV (postoperative nausea and vomiting)   . Toe fracture, right 2012   rt great toe  . UTI (lower urinary tract infection)    Allergies  Allergen Reactions  . Codeine Nausea Only  . Vicodin [Hydrocodone-Acetaminophen] Nausea Only   Past Surgical History:  Procedure Laterality Date  . APPENDECTOMY  2005  . CESAREAN SECTION  [phone]  . LAPAROSCOPIC ABDOMINAL EXPLORATION     Family History  Problem Relation Age of Onset  . Breast cancer Mother  35  . Dementia Mother   . Prostate cancer Father   . Colon polyps Father   . Diabetes Paternal Uncle   . Heart disease Paternal Uncle   . Early death Paternal Uncle   . Depression Maternal Grandmother   . Diabetes Maternal Grandfather   . Diabetes Paternal Grandfather   . Colon cancer Neg Hx   . Rectal cancer Neg Hx   .  Stomach cancer Neg Hx    Social History   Social History Narrative   [place] 3 adult children [name], [place], [place]).   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    Takes daily vitamin   Wears seatbelt   exercises routinely.    Smoke detector in the home.    Feels safe in her relationships.     Allergies as of 02/04/2020      Reactions   Codeine Nausea Only   Vicodin [hydrocodone-acetaminophen] Nausea Only      Medication List       Accurate as of February 04, 2020 11:59 PM. If you have any questions, ask your nurse or doctor.        STOP taking these medications   venlafaxine XR 37.5 MG 24 hr capsule Commonly known as: Effexor XR Stopped by: Howard Pouch, DO     TAKE these medications   Magnesium 400 MG Tabs Take 1 tablet by mouth daily.   MULTIVITAMIN ADULT PO Take by mouth.   PRESCRIPTION MEDICATION Topical cream for skin cancer-? Name   Vitamin D (Cholecalciferol) 25 MCG (1000 UT) Caps Take 1 capsule by mouth daily.       All past medical history, surgical history, allergies, family history, immunizations andmedications were updated in the EMR today and reviewed under the history and medication portions of their EMR.      MR Cervical Spine Wo Contrast Result Date: 08/21/2018  IMPRESSION: Small broad-based disc bulge at C5-6 without neural impingement. Otherwise, essentially normal MRI of the cervical spine.   ROS: 14 pt review of systems performed and negative (unless mentioned in an HPI)  Objective: BP 108/72 (BP Location: Left Arm, Patient Position: Sitting, Cuff Size: Normal)   Pulse 67   Temp (!) 97.5 F (36.4 C) (Temporal)   Resp 18   Ht 5' 5.8" (1.671 m)   Wt 172 lb (78 kg)   SpO2 100%   BMI 27.93 kg/m  Gen: Afebrile. No acute distress. Nontoxic in appearance, well-developed, well-nourished, mildly overweight female. HENT: AT. [place]. Bilateral TM visualized and normal in appearance, normal external  auditory canal. MMM, no oral lesions, adequate dentition. Bilateral nares within normal limits. Throat without erythema, ulcerations or exudates.  No cough on exam, no hoarseness on exam. Eyes:Pupils Equal Round Reactive to light, Extraocular movements intact,  Conjunctiva without redness, discharge or icterus. Neck/lymp/endocrine: Supple, no lymphadenopathy, no thyromegaly CV: RRR no murmur, no edema, +2/4 P posterior tibialis pulses.  No carotid bruits. No JVD. Chest: CTAB, no wheeze, rhonchi or crackles.  Normal respiratory effort.  Good air movement. Abd: Soft.  Flat. NTND. BS present.  No masses palpated. No hepatosplenomegaly. No rebound tenderness or guarding. Skin: No rashes, purpura or petechiae. Warm and well-perfused. Skin intact. Neuro/Msk:  Normal gait. PERLA. EOMi. Alert. Oriented x3.  Cranial nerves II through XII intact. Muscle strength 5/5 upper/lower extremity. DTRs equal bilaterally. Psych: Normal affect, dress and demeanor. Normal speech. Normal thought content and judgment.  No exam data present  Assessment/plan: Michelle Ochoa is  a 59 y.o. female present for CPE Elevated hemoglobin A1c/overweight - Comprehensive metabolic panel - Hemoglobin A1c -Lipid panel Vitamin D deficiency Patient reports 1000 units daily of supplementation - Vitamin D (25 hydroxy) Screening for deficiency anemia - CBC Fatigue/Numbness and tingling in both hands TSH collected today Patient has had a full evaluation for this condition.  She has been referred to neurology and they performed nerve conduction studies as well.  Has been seen by sports medicine who attempted to treat with OMT and medications. She does have evidence of mild cervical degenerative disc disease at C5-6 with a small broad-based disc bulge without neural impingement by MRI 08/2018 (reviewed today).  She does endorse having worsening symptoms and would like referral to a neurologist in [facility]. Neurology referral placed for  her today. Encouraged her to restart her stretches and exercises. -Encounter for hepatitis C screening test for low risk patient - Hepatitis C Antibody Encounter for preventive health examination Patient was encouraged to exercise greater than 150 minutes a week. Patient was encouraged to choose a diet filled with fresh fruits and vegetables, and lean meats. AVS provided to patient today for education/recommendation on gender specific health and safety maintenance. Colonoscopy: completed 10/2017, Dr. Loletha Carrow- normal- 10 yr rpt.  Mammogram: completed: 2019. Family history in her mother at age 47. Would advise yearly screening. Patient follows with gynecology for her mammograms. Cervical cancer screening: 2019 per patient, [place] gynecologist. Immunizations: tdap UTD 2017, Influenza UTD 2020(encouraged yearly).  Covid Moderna vaccine-series completed. Infectious disease screening:  HIV screening declined, hepatitis C screening completed today DEXA: normal, 2007  Return in about 1 year (around 02/03/2021) for CPE (30 min).   Orders Placed This Encounter  Procedures  . CBC  . Comprehensive metabolic panel  . Hemoglobin A1c  . Lipid panel  . TSH  . Vitamin D (25 hydroxy)  . Hepatitis C Antibody  . Ambulatory referral to Neurology   No orders of the defined types were placed in this encounter.  Referrals: Neurology referral placed today  Electronically signed by: Howard Pouch, [place]

## 2020-02-05 ENCOUNTER — Telehealth: Payer: Self-pay | Admitting: Family Medicine

## 2020-02-05 LAB — HEPATITIS C ANTIBODY
Hepatitis C Ab: NONREACTIVE
SIGNAL TO CUT-OFF: 0.01 (ref <1.00)

## 2020-02-05 NOTE — Telephone Encounter (Signed)
Please call patient: Thyroid, liver and kidney function are normal. Blood cell counts and electrolytes are normal. Vitamin D is mildly low at 25.9> increase vitamin D supplementation from 1000 to 2000 units daily. Diabetes screening is normal. Hepatitis C screening is negative/normal.  Cholesterol is significantly elevated with an LDL of 181.  LDL is the "bad "cholesterol.  Her last LDL was 131.  Her cholesterol level does put her at increased risk of heart attack and/or stroke and a medication start, as well as diet and exercise modifications are encouraged.  The medication type is called a statin, offers cardiovascular protection against heart attack/stroke and helps lower the cholesterol.  If she is agreeable to start medication I will call this in for her today.  I would recommend a mediterranean diet and regular exercise.  A mediterranean diet is high in fruits, vegetables, whole grains, fish, chicken, nuts, healthy fats (olive oil or canola oil). Low fat dairy. There are many online resources and books on this diet. Limit butter, margarine, red meat and sweets.   Follow-up in 3 months with provider to check cholesterol levels.

## 2020-02-05 NOTE — Telephone Encounter (Signed)
Patient returned call and advised of lab results. She does not want to start medication at this time but will follow PCP recommendations regarding diet/exercise. Will call back to schedule 3 month f/u

## 2020-02-05 NOTE — Telephone Encounter (Signed)
LM for pt to returncall

## 2020-02-06 ENCOUNTER — Encounter: Payer: Self-pay | Admitting: Family Medicine

## 2020-02-25 IMAGING — DX DG CERVICAL SPINE 2 OR 3 VIEWS
3 series · 3 of 3 positions shown · non-contrast
Comparison: None

CLINICAL DATA: Numbness and tingling of the fingers for the past 3
months. Symptoms are worsening. No known injury.

EXAM:
CERVICAL SPINE - 2-3 VIEW

[c-spine lat]
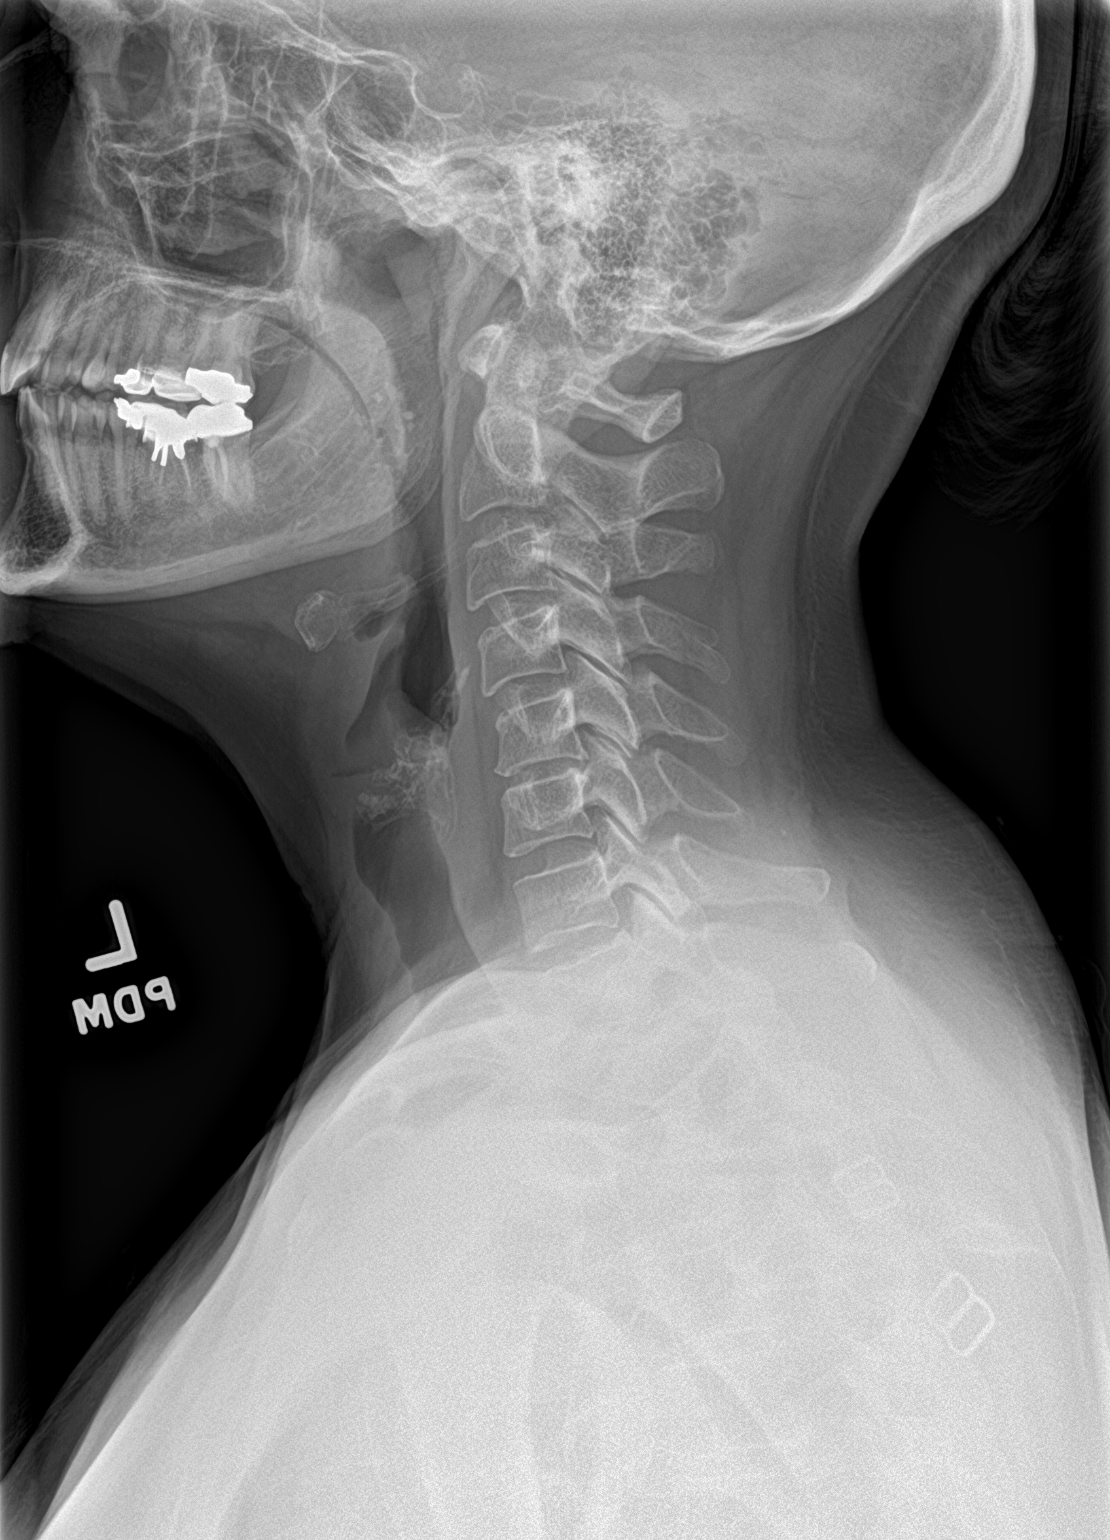

[c-spine ap]
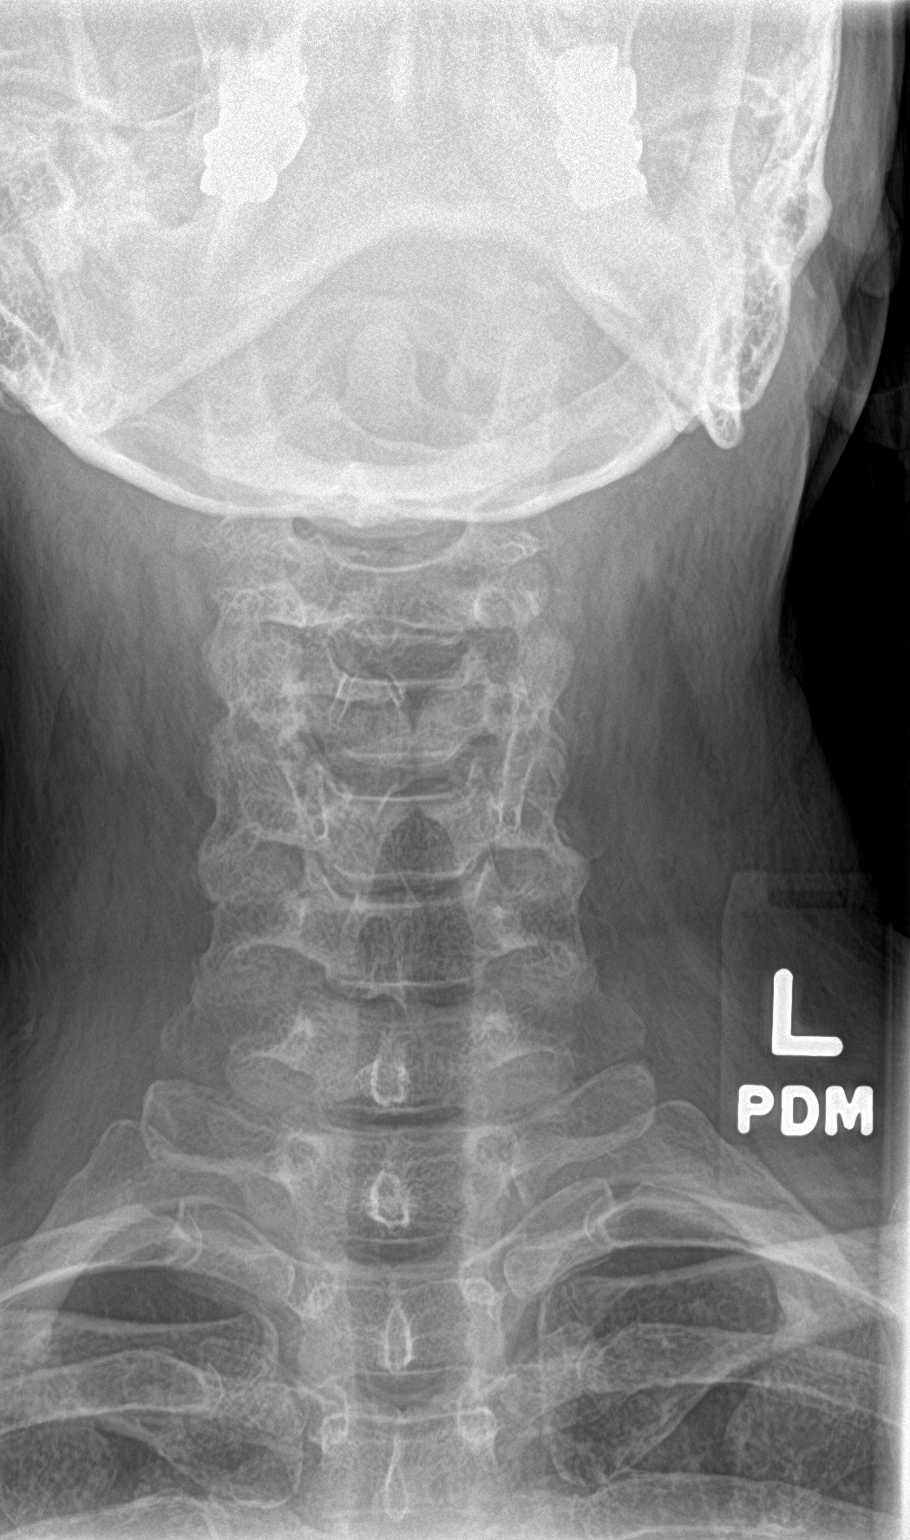

[c-spine open mouth]
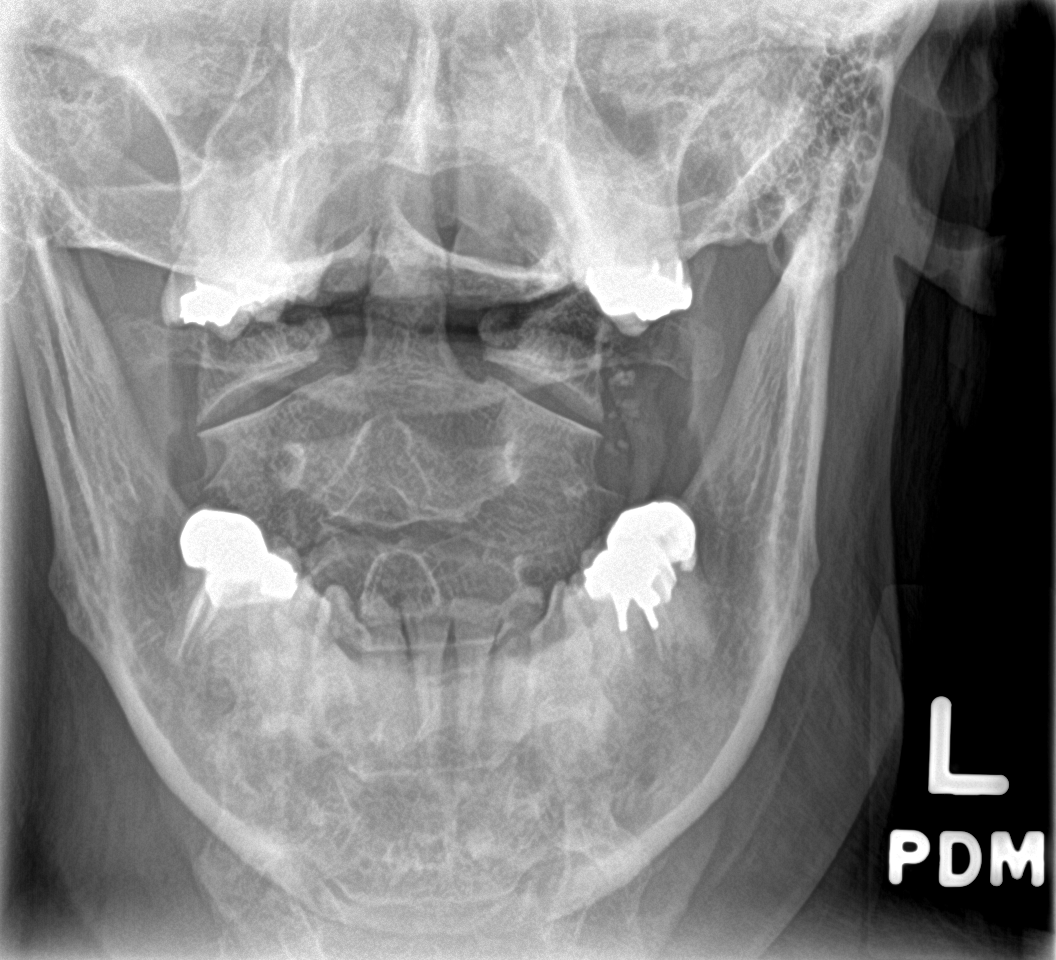

[3 of 3 positions shown; findings below may reference images not displayed]

FINDINGS: The cervical vertebral bodies are preserved in height. There is mild
disc space narrowing at C5-6. There is minimal anterolisthesis of C6
with respect to C5. there is no perched facet or spinous process
fracture. The odontoid is intact. The prevertebral soft tissue
spaces are normal.
IMPRESSION: Mild disc space narrowing at C5-6 with grade 1 anterolisthesis of C5
with respect to C6 amounting to approximately 2 mm. No compression
fracture or significant facet joint abnormality.

## 2020-02-28 DIAGNOSIS — Z01419 Encounter for gynecological examination (general) (routine) without abnormal findings: Secondary | ICD-10-CM | POA: Diagnosis not present

## 2020-02-28 DIAGNOSIS — Z6828 Body mass index (BMI) 28.0-28.9, adult: Secondary | ICD-10-CM | POA: Diagnosis not present

## 2020-02-28 DIAGNOSIS — Z1231 Encounter for screening mammogram for malignant neoplasm of breast: Secondary | ICD-10-CM | POA: Diagnosis not present

## 2020-04-01 DIAGNOSIS — F419 Anxiety disorder, unspecified: Secondary | ICD-10-CM | POA: Diagnosis not present

## 2020-04-01 DIAGNOSIS — R4189 Other symptoms and signs involving cognitive functions and awareness: Secondary | ICD-10-CM | POA: Diagnosis not present

## 2020-04-01 DIAGNOSIS — G540 Brachial plexus disorders: Secondary | ICD-10-CM | POA: Diagnosis not present

## 2020-04-03 DIAGNOSIS — F419 Anxiety disorder, unspecified: Secondary | ICD-10-CM | POA: Insufficient documentation

## 2020-04-03 DIAGNOSIS — G540 Brachial plexus disorders: Secondary | ICD-10-CM | POA: Insufficient documentation

## 2020-06-16 ENCOUNTER — Telehealth: Payer: Self-pay

## 2020-06-16 NOTE — Telephone Encounter (Signed)
Pt is overdue for a 3 month f/u. Labs will be completed at time of visit.

## 2020-06-16 NOTE — Telephone Encounter (Signed)
Patient is calling to schedule a lab appointment to recheck cholesterol & Vitamin D. No orders found.

## 2020-06-20 NOTE — Telephone Encounter (Signed)
LM for patient to CB to schedule appointment with provider.

## 2020-07-15 DIAGNOSIS — H40013 Open angle with borderline findings, low risk, bilateral: Secondary | ICD-10-CM | POA: Diagnosis not present

## 2020-07-18 DIAGNOSIS — Z8619 Personal history of other infectious and parasitic diseases: Secondary | ICD-10-CM

## 2020-07-18 HISTORY — DX: Personal history of other infectious and parasitic diseases: Z86.19

## 2020-07-22 ENCOUNTER — Encounter: Payer: Self-pay | Admitting: Family Medicine

## 2020-07-22 ENCOUNTER — Other Ambulatory Visit: Payer: Self-pay

## 2020-07-22 ENCOUNTER — Ambulatory Visit: Payer: BC Managed Care – PPO | Admitting: Family Medicine

## 2020-07-22 VITALS — BP 117/79 | HR 65 | Temp 98.6°F | Ht 65.75 in | Wt 163.0 lb

## 2020-07-22 DIAGNOSIS — E1169 Type 2 diabetes mellitus with other specified complication: Secondary | ICD-10-CM | POA: Diagnosis not present

## 2020-07-22 DIAGNOSIS — E785 Hyperlipidemia, unspecified: Secondary | ICD-10-CM

## 2020-07-22 DIAGNOSIS — E559 Vitamin D deficiency, unspecified: Secondary | ICD-10-CM | POA: Diagnosis not present

## 2020-07-22 DIAGNOSIS — Z23 Encounter for immunization: Secondary | ICD-10-CM | POA: Diagnosis not present

## 2020-07-22 NOTE — Patient Instructions (Addendum)
Great job with the weight loss and diet changes.   We will call you with lab results   High Cholesterol  High cholesterol is a condition in which the blood has high levels of a white, waxy, fat-like substance (cholesterol). The human body needs small amounts of cholesterol. The liver makes all the cholesterol that the body needs. Extra (excess) cholesterol comes from the food that we eat. Cholesterol is carried from the liver by the blood through the blood vessels. If you have high cholesterol, deposits (plaques) may build up on the [place] of your blood vessels (arteries). Plaques make the arteries narrower and stiffer. Cholesterol plaques increase your risk for heart attack and stroke. Work with your health care provider to keep your cholesterol levels in a healthy range. What increases the risk? This condition is more likely to develop in people who:  Eat foods that are high in animal fat (saturated fat) or cholesterol.  Are overweight.  Are not getting enough exercise.  Have a family history of high cholesterol. What are the signs or symptoms? There are no symptoms of this condition. How is this diagnosed? This condition may be diagnosed from the results of a blood test.  If you are older than age 76, your health care provider may check your cholesterol every 4-6 years.  You may be checked more often if you already have high cholesterol or other risk factors for heart disease. The blood test for cholesterol measures:  "Bad" cholesterol (LDL cholesterol). This is the main type of cholesterol that causes heart disease. The desired level for LDL is less than 100.  "Good" cholesterol (HDL cholesterol). This type helps to protect against heart disease by cleaning the arteries and carrying the LDL away. The desired level for HDL is 60 or higher.  Triglycerides. These are fats that the body can store or burn for energy. The desired number for triglycerides is lower than 150.  Total  cholesterol. This is a measure of the total amount of cholesterol in your blood, including LDL cholesterol, HDL cholesterol, and triglycerides. A healthy number is less than 200. How is this treated? This condition is treated with diet changes, lifestyle changes, and medicines. Diet changes  This may include eating more whole grains, fruits, vegetables, nuts, and fish.  This may also include cutting back on red meat and foods that have a lot of added sugar. Lifestyle changes  Changes may include getting at least 40 minutes of aerobic exercise 3 times a week. Aerobic exercises include walking, biking, and swimming. Aerobic exercise along with a healthy diet can help you maintain a healthy weight.  Changes may also include quitting smoking. Medicines  Medicines are usually given if diet and lifestyle changes have failed to reduce your cholesterol to healthy levels.  Your health care provider may prescribe a statin medicine. Statin medicines have been shown to reduce cholesterol, which can reduce the risk of heart disease. Follow these instructions at home: Eating and drinking If told by your health care provider:  Eat chicken (without skin), fish, veal, shellfish, ground [place] breast, and round or loin cuts of red meat.  Do not eat fried foods or fatty meats, such as hot dogs and salami.  Eat plenty of fruits, such as apples.  Eat plenty of vegetables, such as broccoli, potatoes, and carrots.  Eat beans, peas, and lentils.  Eat grains such as barley, rice, couscous, and bulgur wheat.  Eat pasta without cream sauces.  Use skim or nonfat milk, and  eat low-fat or nonfat yogurt and cheeses.  Do not eat or drink whole milk, cream, ice cream, egg yolks, or hard cheeses.  Do not eat stick margarine or tub margarines that contain trans fats (also called partially hydrogenated oils).  Do not eat saturated tropical oils, such as coconut oil and palm oil.  Do not eat cakes, cookies,  crackers, or other baked goods that contain trans fats.  General instructions  Exercise as directed by your health care provider. Increase your activity level with activities such as gardening, walking, and taking the stairs.  Take over-the-counter and prescription medicines only as told by your health care provider.  Do not use any products that contain nicotine or tobacco, such as cigarettes and e-cigarettes. If you need help quitting, ask your health care provider.  Keep all follow-up visits as told by your health care provider. This is important. Contact a health care provider if:  You are struggling to maintain a healthy diet or weight.  You need help to start on an exercise program.  You need help to stop smoking. Get help right away if:  You have chest pain.  You have trouble breathing. This information is not intended to replace advice given to you by your health care provider. Make sure you discuss any questions you have with your health care provider. Document Revised: 10/07/2017 Document Reviewed: 04/03/2016 Elsevier Patient Education  [employer].

## 2020-07-22 NOTE — Progress Notes (Signed)
This visit occurred during the SARS-CoV-2 public health emergency.  Safety protocols were in place, including screening questions prior to the visit, additional usage of staff PPE, and extensive cleaning of exam room while observing appropriate contact time as indicated for disinfecting solutions.    Patient ID: Michelle Ochoa, female  DOB: 1961/10/06, 59 y.o.   MRN: [id number] Patient Care Team    Relationship Specialty Notifications Start End  Ma Hillock, DO PCP - General Family Medicine  05/20/16   Dian Queen, MD Consulting Physician Obstetrics and Gynecology  05/20/16   Doran Stabler, MD Consulting Physician Gastroenterology  02/04/20   Lyndal Pulley, [place] Physician Sports Medicine  02/04/20   Star Age, MD Attending Physician Neurology  02/04/20     Chief Complaint  Patient presents with  . Follow-up    [facility]    Subjective: Michelle Ochoa is a 59 y.o.  Female  present for  Hyperlipidemia/overweight: Pt reports she has increased her exercise and made some dietary changes. She stopped the keto diet and cut back on bacon, eggs and pimento cheese. She has lost 8 lbs. She is prescribed a dietary stimulant by her gynecologist. She complains of difficulty sleeping (presnet before stimulant). She does take a melatonin gummy nightly and prefers not to start prescribed medication. She will occassionally take a benadryl to help her sleep. She declined a statin start after last visit with a LDL of 181.   Vit d def: She is taking 1000-2000 otc u a day. Last levels were 25 and pt encouraged to increase to 2000u. She would like to have levels retested today.    Depression screen [facility] 2/9 07/22/2020 02/04/2020 11/02/2017 05/25/2016  Decreased Interest 0 0 0 0  Down, Depressed, Hopeless 0 0 0 0  PHQ - 2 Score 0 0 0 0   No flowsheet data found.   Immunization History  Administered Date(s) Administered  . Influenza,inj,Quad PF,6+ Mos 07/18/2016, 11/02/2017, 10/25/2018,  07/22/2020  . Influenza-Unspecified 07/18/2016, 11/02/2017, 10/25/2018  . Moderna SARS-COVID-2 Vaccination 11/18/2019, 12/16/2019  . Tdap 06/08/2016    Past Medical History:  Diagnosis Date  . Anemia    with 1st childbirth  . Arthritis   . Basal cell carcinoma   . Chicken pox   . Headache   . Insomnia   . PONV (postoperative nausea and vomiting)   . Toe fracture, right 2012   rt great toe  . UTI (lower urinary tract infection)    Allergies  Allergen Reactions  . Codeine Nausea Only  . Vicodin [Hydrocodone-Acetaminophen] Nausea Only   Past Surgical History:  Procedure Laterality Date  . APPENDECTOMY  2005  . CESAREAN SECTION  [phone]  . LAPAROSCOPIC ABDOMINAL EXPLORATION     Family History  Problem Relation Age of Onset  . Breast cancer Mother        28  . Dementia Mother   . Prostate cancer Father   . Colon polyps Father   . Diabetes Paternal Uncle   . Heart disease Paternal Uncle   . Early death Paternal Uncle   . Depression Maternal Grandmother   . Diabetes Maternal Grandfather   . Diabetes Paternal Grandfather   . Colon cancer Neg Hx   . Rectal cancer Neg Hx   . Stomach cancer Neg Hx    Social History   Social History Narrative   Married, OceanographerPanther). 3 adult children [name], [place], [place]).   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    Takes daily vitamin   Wears seatbelt   exercises routinely.    Smoke detector in the home.    Feels safe in her relationships.     Allergies as of 07/22/2020      Reactions   Codeine Nausea Only   Vicodin [hydrocodone-acetaminophen] Nausea Only      Medication List       Accurate as of July 22, 2020  2:09 PM. If you have any questions, ask your nurse or doctor.        STOP taking these medications   clindamycin 1 % lotion Commonly known as: CLEOCIN T Stopped by: Howard Pouch, DO   fluorouracil 5 % cream Commonly known as: EFUDEX Stopped by: Howard Pouch, DO    MULTIVITAMIN ADULT PO Stopped by: Howard Pouch, DO     TAKE these medications   Diethylpropion HCl CR 75 MG Tb24 Take 1 tablet by mouth daily.   Magnesium 400 MG Tabs Take 1 tablet by mouth daily.   PRESCRIPTION MEDICATION Topical cream for skin cancer-? Name   Vitamin D (Cholecalciferol) 25 MCG (1000 UT) Caps Take 1 capsule by mouth daily.       All past medical history, surgical history, allergies, family history, immunizations andmedications were updated in the EMR today and reviewed under the history and medication portions of their EMR.      MR Cervical Spine Wo Contrast Result Date: 08/21/2018  IMPRESSION: Small broad-based disc bulge at C5-6 without neural impingement. Otherwise, essentially normal MRI of the cervical spine.   ROS: 14 pt review of systems performed and negative (unless mentioned in an HPI)  Objective: BP 117/79   Pulse 65   Temp 98.6 F (37 C) (Oral)   Ht 5' 5.75" (1.67 m)   Wt 163 lb (73.9 kg)   SpO2 99%   BMI 26.51 kg/m  Gen: Afebrile. No acute distress.  HENT: AT. [place].  Eyes:Pupils Equal Round Reactive to light, Extraocular movements intact,  Conjunctiva without redness, discharge or icterus. CV: RRR no murmur Chest: CTAB, no wheeze or crackles Neuro: Normal gait. PERLA. EOMi. Alert. Orientedx3 Psych: Normal affect, dress and demeanor. Normal speech. Normal thought content and judgment.  No exam data present  Assessment/plan: [name] is a 59 y.o. female present for CPE Overweight/hyperlipidemia - lipid panel collected today. Pt reports she is not fasting. She has lost weight.   Vitamin D deficiency Patient reports 1000-2000 units daily of supplementation - Vitamin D (25 hydroxy)  Influenza vaccine administered today  No follow-ups on file.   Orders Placed This Encounter  Procedures  . Flu Vaccine QUAD 6+ mos PF IM (Fluarix Quad PF)  . Vitamin D (25 hydroxy)  . Lipid panel   No orders of the defined types were  placed in this encounter.  Referrals: Neurology referral placed today  Electronically signed by: Howard Pouch, [place]

## 2020-07-23 ENCOUNTER — Telehealth: Payer: Self-pay | Admitting: Family Medicine

## 2020-07-23 DIAGNOSIS — Z5320 Procedure and treatment not carried out because of patient's decision for unspecified reasons: Secondary | ICD-10-CM | POA: Insufficient documentation

## 2020-07-23 LAB — LIPID PANEL
Cholesterol: 273 mg/dL — ABNORMAL HIGH (ref <200)
HDL: 54 mg/dL (ref >=50)
LDL Cholesterol (Calc): 186 mg/dL (calc) — ABNORMAL HIGH
Non-HDL Cholesterol (Calc): 219 mg/dL (calc) — ABNORMAL HIGH (ref <130)
Total CHOL/HDL Ratio: 5.1 (calc) — ABNORMAL HIGH (ref <5.0)
Triglycerides: 179 mg/dL — ABNORMAL HIGH (ref <150)

## 2020-07-23 LAB — VITAMIN D 25 HYDROXY (VIT D DEFICIENCY, FRACTURES): Vit D, 25-Hydroxy: 27 ng/mL — ABNORMAL LOW (ref 30–100)

## 2020-07-23 NOTE — Telephone Encounter (Signed)
Please call patient: Vitamin D is 27-I would encourage her to increase her vitamin D supplementation by 1000 units daily. (Example: If taking 2000 units, increase to 3000 units) Although she has lost weight and made great dietary changes, her cholesterol panel is about the same with a total cholesterol of 273 and an LDL of 186.  -She elected not to start a statin in April when cholesterol was found to be high.  However, I would strongly encourage a statin medication to provide her cardiovascular protection.  She is at higher risk than average population for heart attack or stroke with an LDL level of 186. -I know she is concerned about statin medications in relation to increased dementia with her family history.   However, it is important for her to understand that high cholesterol can build up in the vessels of the brain as it does around the heart and cause vascular dementia.   -The other option to try is a medication called Zetia, which is not in the statin group and not associated with dementia, there are also medications that she can self inject every 14 days that help lower cholesterol.   Please advise if she is agreeable to any of the options presented above and I will call those in for her.

## 2020-07-24 NOTE — Telephone Encounter (Signed)
Spoke with pt regarding labs and instructions. Pt states she is not taking vit d daily. Pt agress to starting Zetia and will like to sent to [employer] on file

## 2020-07-28 ENCOUNTER — Encounter: Payer: Self-pay | Admitting: Family Medicine

## 2020-07-28 ENCOUNTER — Ambulatory Visit (INDEPENDENT_AMBULATORY_CARE_PROVIDER_SITE_OTHER): Payer: BC Managed Care – PPO | Admitting: Family Medicine

## 2020-07-28 ENCOUNTER — Telehealth: Payer: Self-pay

## 2020-07-28 ENCOUNTER — Other Ambulatory Visit: Payer: Self-pay

## 2020-07-28 VITALS — BP 104/69 | HR 69 | Temp 97.8°F | Resp 16 | Ht 65.75 in | Wt 164.8 lb

## 2020-07-28 DIAGNOSIS — B029 Zoster without complications: Secondary | ICD-10-CM

## 2020-07-28 MED ORDER — VALACYCLOVIR HCL 1 G PO TABS: 1000.0000 mg | ORAL_TABLET | Freq: Three times a day (TID) | ORAL | 0 refills | Status: DC

## 2020-07-28 NOTE — Patient Instructions (Signed)
When the tiny blisters start to break open then start covering the rash with moist gauze (with dry layer of gauze over the moist layer).  Change this dressing once daily. Gently clean the area with antibacterial soap water once a day. As the rash dries up and fades you do not need to cover it anymore.  Ok to continue advil 3 tabs every 6 hours as needed for pain (take with food). OK to continue benadryl 25mg  every 6 hours as needed for itching.  Expect peak of rash in a few days, then gradual resolution of rash over the next couple weeks.  I sent the antiviral med Public relations account executive) to your pharmacy.

## 2020-07-28 NOTE — Progress Notes (Signed)
OFFICE VISIT  07/28/2020  CC:  Chief Complaint  Patient presents with  . Rash    x 3-4 days, burning, itching that spread to L side of arm and breast, sore off and om   HPI:    Patient is a 59 y.o. Caucasian female who presents for rash. A week ago felt pain, thought pulled muscle--L scapula region.  Got very itchy, then intermittent pain, then burning and rash onset 4 d/a.  Started wrapping around L axillary region and into breast region.  Pain waxing and waning.  Rash starting to look more vesicular.  Lots of stress lately, otherwise has been well.   Past Medical History:  Diagnosis Date  . Anemia    with 1st childbirth  . Arthritis   . Basal cell carcinoma   . Chicken pox   . Headache   . Insomnia   . PONV (postoperative nausea and vomiting)   . Toe fracture, right 2012   rt great toe  . UTI (lower urinary tract infection)     Past Surgical History:  Procedure Laterality Date  . APPENDECTOMY  2005  . CESAREAN SECTION  [phone]  . LAPAROSCOPIC ABDOMINAL EXPLORATION      Outpatient Medications Prior to Visit  Medication Sig Dispense Refill  . Diethylpropion HCl CR 75 MG TB24 Take 1 tablet by mouth daily.    . Vitamin D, Cholecalciferol, 25 MCG (1000 UT) CAPS Take 1 capsule by mouth daily.    . Magnesium 400 MG TABS Take 1 tablet by mouth daily. (Patient not taking: Reported on 07/28/2020)    . PRESCRIPTION MEDICATION Topical cream for skin cancer-? Name (Patient not taking: Reported on 07/28/2020)     No facility-administered medications prior to visit.    Allergies  Allergen Reactions  . Codeine Nausea Only  . Vicodin [Hydrocodone-Acetaminophen] Nausea Only    ROS As per HPI  PE: Vitals with BMI 07/28/2020 07/22/2020 02/04/2020  Height 5' 5.75" 5' 5.75" 5' 5.8"  Weight 164 lbs 13 oz 163 lbs 172 lbs  BMI 26.8 85.88 50.27  Systolic 741 287 867  Diastolic 69 79 72  Pulse 69 65 67     Exam chaperoned by Deveron Furlong, CMA. SKIN: lower aspect  of L scapula region (starting at midline) has erythematous vesicular rash, a few patches scattered along dermatomal pattern extending under axilla and involving lateral aspect of L breast.  All vesicles intact.  Area signif sensitive to touch.  LABS:    Chemistry      Component Value Date/Time   NA 137 02/04/2020 1038   K 4.5 02/04/2020 1038   CL 102 02/04/2020 1038   CO2 28 02/04/2020 1038   BUN 11 02/04/2020 1038   CREATININE 0.75 02/04/2020 1038      Component Value Date/Time   CALCIUM 9.8 02/04/2020 1038   ALKPHOS 81 02/04/2020 1038   AST 23 02/04/2020 1038   ALT 32 02/04/2020 1038   BILITOT 0.9 02/04/2020 1038     Lab Results  Component Value Date   WBC 4.9 02/04/2020   HGB 13.8 02/04/2020   HCT 41.3 02/04/2020   MCV 87.9 02/04/2020   PLT 198.0 02/04/2020   Lab Results  Component Value Date   HGBA1C 5.6 02/04/2020    IMPRESSION AND PLAN:  Herpes zoster, L  Mid/upper back (approx T3 dermatome level). Although on day 4 of rash, will go ahead and do trial of valtrex 1g tid x 7d. Advil treating her pain ok at this time.  Recommended shingrix in 3 mo.  Instructions: When the tiny blisters start to break open then start covering the rash with moist gauze (with dry layer of gauze over the moist layer).  Change this dressing once daily. Gently clean the area with antibacterial soap water once a day. As the rash dries up and fades you do not need to cover it anymore.  Ok to continue advil 3 tabs every 6 hours as needed for pain (take with food). OK to continue benadryl 25mg  every 6 hours as needed for itching.  Expect peak of rash in a few days, then gradual resolution of rash over the next couple weeks.  I sent the antiviral med Public relations account executive) to your pharmacy.  An After Visit Summary was printed and given to the patient.  FOLLOW UP: No follow-ups on file.  Signed:  Crissie Sickles, MD           07/28/2020

## 2020-07-28 NOTE — Telephone Encounter (Signed)
Appt scheduled, 10/11 @ 9:30

## 2020-07-28 NOTE — Telephone Encounter (Signed)
Called patient to offer appt for today with Dr.McGowen, left message to return call.  Patient Name: Michelle Ochoa Gender: Female DOB: Oct 13, 1961 Age: 59 Y 11 M 18 D Return Phone Number: [phone] (Primary) Address: City/State/Zip: [place] [address] Client [facility] - Client Client Site [place] - Day Physician Raoul Pitch, [place] Contact Type Call Who Is Calling Patient / Member / Family / Caregiver Call Type Triage / Clinical Relationship To Patient Self Return Phone Number [phone] (Primary) Chief Complaint Rash - Localized Reason for Call Symptomatic / Request for [place] states she thinks she may have shingles with burning and itching on her upper body. Translation No Nurse Assessment Nurse: Mariea Clonts, RN, Brandi Date/Time (Eastern Time): 07/26/2020 12:29:29 PM Confirm and document reason for call. If symptomatic, describe symptoms. ---Caller states she thinks she may have shingles with burning and itching on her upper body. States a rash as well as pain intching burning on shoulder blade in middle of back and goes around to front. Rash is getting worse. Looks like a lot of bug bites and one is bleeding. Started with pain earlier in the week. No chest pain. No fever. Does the patient have any new or worsening symptoms? ---Yes Will a triage be completed? ---Yes Related visit to physician within the last 2 weeks? ---No Does the PT have any chronic conditions? (i.e. diabetes, asthma, this includes High risk factors for pregnancy, etc.) ---No Is this a behavioral health or substance abuse call? ---No Guidelines Guideline Title Affirmed Question Affirmed Notes Nurse Date/Time (Eastern Time) Shingles [1] Shingles rash (matches SYMPTOMS) AND [2] onset within past 72 hours Reed, Therapist, sports, Brandi 07/26/2020 12:32:17 PM Back Pain Rash in same area as pain (may be described as "small blisters") Mariea Clonts, RN,  Brandi 07/26/2020 12:37:45 PM Disp. Time Eilene Ghazi Time) Disposition Final User 07/26/2020 12:36:44 PM See PCP within 24 Hours Mariea Clonts, RN, Velna Hatchet 07/26/2020 12:39:45 PM See PCP within 24 Hours Yes Mariea Clonts, Therapist, sports, Velna Hatchet PLEASE NOTE: All timestamps contained within this report are represented as [place] Standard Time. CONFIDENTIALTY NOTICE: This fax transmission is intended only for the addressee. It contains information that is legally privileged, confidential or otherwise protected from use or disclosure. If you are not the intended recipient, you are strictly prohibited from reviewing, disclosing, copying using or disseminating any of this information or taking any action in reliance on or regarding this information. If you have received this fax in error, please notify us immediately by telephone so that we can arrange for its return to [place]. Phone: [phone], Toll-Free: [phone], Fax: [phone] Page: 2 of 2 Call Id: [id number] [place] Disagree/Comply Disagree Caller Understands Yes PreDisposition Search internet for information Care Advice Given Per Guideline SEE PCP WITHIN 24 HOURS: * IF OFFICE WILL BE OPEN: You need to be examined within the next 24 hours. Call your doctor (or NP/PA) when the office opens and make an appointment. PAIN MEDICINES: * For pain relief, you can take either acetaminophen, ibuprofen, or naproxen. * Try not to scratch. DON'T SCRATCH: * Scratching makes the itching worse (the 'ItchScratch' cycle). REDUCING THE ITCH - CALAMINE LOTION: ANTIHISTAMINE FOR ITCHING: * Take an antihistamine by mouth to reduce the itching. Benadryl (OTC diphenhydramine) is best. Adult dose is 25-50 mg. Take it up to 4 times a day. CONTAGIOUSNESS: * The shingles sores contain the chickenpox virus. So while others cannot get shingles from you, it is possible that they can get chickenpox (if they never  had it before). * Avoid contact with pregnant women. Avoid contact with anyone who has a weakened  immune system (immunocompromised) (e.g., HIV positive, cancer chemotherapy, chronic steroid treatment, splenectomy, organ transplant). CALL BACK IF: * Fever over 100.4 F (38.0 C) * You become worse CARE ADVICE given per Shingles (Adult) guideline. REDUCING THE ITCH - OATMEAL (AVEENO) BATH: SEE PCP WITHIN 24 HOURS: * IF OFFICE WILL BE CLOSED: You need to be seen within the next 24 hours. A clinic or an urgent care center is often a good source of care if your doctor's office is closed or you can't get an appointment. * IF OFFICE WILL BE OPEN: You need to be examined within the next 24 hours. Call your doctor (or NP/PA) when the office opens and make an appointment. * You become worse CALL BACK IF: AVOID CONTACT: * With pregnant women, infants and people with weakened immune systems (e.g., transplant patient, chemotherapy, AIDS) until the cause of the rash is known. Referrals GO TO FACILITY REFUSED

## 2020-07-31 ENCOUNTER — Telehealth: Payer: Self-pay

## 2020-07-31 MED ORDER — EZETIMIBE 10 MG PO TABS: 10.0000 mg | ORAL_TABLET | Freq: Every day | ORAL | 3 refills | Status: DC

## 2020-07-31 NOTE — Telephone Encounter (Signed)
Pt aware of Rx sent °

## 2020-07-31 NOTE — Telephone Encounter (Signed)
Zetia prescribed, please make patient aware Thanks

## 2020-07-31 NOTE — Telephone Encounter (Signed)
PA for ezetimibe started on covermymeds Key: [id number] - PA Case ID: [id number] - Rx #: [id number]

## 2020-07-31 NOTE — Addendum Note (Signed)
Addended by: Howard Pouch A on: 07/31/2020 07:25 AM   Modules accepted: Orders

## 2020-08-04 ENCOUNTER — Other Ambulatory Visit: Payer: Self-pay

## 2020-08-04 ENCOUNTER — Ambulatory Visit: Payer: BC Managed Care – PPO | Admitting: Family Medicine

## 2020-08-04 ENCOUNTER — Telehealth: Payer: Self-pay

## 2020-08-04 ENCOUNTER — Encounter: Payer: Self-pay | Admitting: Family Medicine

## 2020-08-04 VITALS — BP 129/78 | HR 85 | Temp 98.7°F | Ht 65.75 in | Wt 165.0 lb

## 2020-08-04 DIAGNOSIS — B029 Zoster without complications: Secondary | ICD-10-CM | POA: Diagnosis not present

## 2020-08-04 MED ORDER — GABAPENTIN 100 MG PO CAPS: 100.0000 mg | ORAL_CAPSULE | Freq: Every day | ORAL | 2 refills | Status: DC

## 2020-08-04 MED ORDER — PRAVASTATIN SODIUM 20 MG PO TABS: 20.0000 mg | ORAL_TABLET | Freq: Every day | ORAL | 3 refills | Status: DC

## 2020-08-04 MED ORDER — VALACYCLOVIR HCL 1 G PO TABS: 1000.0000 mg | ORAL_TABLET | Freq: Three times a day (TID) | ORAL | 0 refills | Status: AC

## 2020-08-04 NOTE — Telephone Encounter (Signed)
PA denied. Pt states she is willing to start a statin drug

## 2020-08-04 NOTE — Patient Instructions (Addendum)
Pick up lidocaine patches to help ease pain.  Gabapentin printed out for you if pain is persistent- take before bed  Valtrex refilled for an additional 5 days.    Pravastatin prescribed- wait to start until after shingles is completely healed.    Next appt 3 mos with provider to have cholesterol rechecked.

## 2020-08-04 NOTE — Telephone Encounter (Signed)
Pt was sched before sched block at 4 pm today

## 2020-08-04 NOTE — Telephone Encounter (Signed)
Patient was seen last week by Dr. Anitra Lauth (Dr. Raoul Pitch out of office) for shingles. Shingles are not any better.  Patient requested in office appt today with Dr. Raoul Pitch.  I scheduled her at 4pm, tentatively, for an in office appt. Is this okay?  I can also block 4:15pm for additional time per provider request.  Please advise. Thank you

## 2020-08-04 NOTE — Progress Notes (Signed)
This visit occurred during the SARS-CoV-2 public health emergency.  Safety protocols were in place, including screening questions prior to the visit, additional usage of staff PPE, and extensive cleaning of exam room while observing appropriate contact time as indicated for disinfecting solutions.    Michelle Ochoa , 1961-08-23, 59 y.o., female MRN: [id number] Patient Care Team    Relationship Specialty Notifications Start End  Ma Hillock, DO PCP - General Family Medicine  05/20/16   Dian Queen, MD Consulting Physician Obstetrics and Gynecology  05/20/16   Doran Stabler, MD Consulting Physician Gastroenterology  02/04/20   Lyndal Pulley, [place] Physician Sports Medicine  02/04/20   Star Age, MD Attending Physician Neurology  02/04/20     Chief Complaint  Patient presents with  . Herpes Zoster    Pt states that pain worsen last with a noticed a new rash location on lower left side trunk; pt is doing better at the moment but valtrex ends today. Pain helps with Advil and leftover Hydrocodone.      Subjective: Pt presents for an OV with complaints of persistent shingles.  She was seen by another provider on October 11 and diagnosed with shingles along the left thoracic dermatome.  She reports onset of symptoms started around October 4.  Her symptoms starting with itching, burning and pain.  A rash presented approximately 5 days after onset of symptoms.  She was prescribed Valtrex on October 11 appointment.  He reports the rash still has 1 blister.  She was using hydrocodone (leftover) for pain.  She does admit the pain is much improved but still present.  She has 2 new areas of concern down near her left hip. Depression screen [facility] 2/9 07/22/2020 02/04/2020 11/02/2017 05/25/2016  Decreased Interest 0 0 0 0  Down, Depressed, Hopeless 0 0 0 0  PHQ - 2 Score 0 0 0 0    Allergies  Allergen Reactions  . Codeine Nausea Only  . Vicodin [Hydrocodone-Acetaminophen] Nausea  Only   Social History   Social History Narrative   Married, [place] 3 adult children [name], [name], [name]).   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    Takes daily vitamin   Wears seatbelt   exercises routinely.    Smoke detector in the home.    Feels safe in her relationships.    Past Medical History:  Diagnosis Date  . Anemia    with 1st childbirth  . Arthritis   . Basal cell carcinoma   . Chicken pox   . Headache   . History of shingles 07/2020  . Insomnia   . PONV (postoperative nausea and vomiting)   . Toe fracture, right 2012   rt great toe  . UTI (lower urinary tract infection)    Past Surgical History:  Procedure Laterality Date  . APPENDECTOMY  2005  . CESAREAN SECTION  [phone]  . LAPAROSCOPIC ABDOMINAL EXPLORATION     Family History  Problem Relation Age of Onset  . Breast cancer Mother        19  . Dementia Mother   . Prostate cancer Father   . Colon polyps Father   . Diabetes Paternal Uncle   . Heart disease Paternal Uncle   . Early death Paternal Uncle   . Depression Maternal Grandmother   . Diabetes Maternal Grandfather   . Diabetes Paternal Grandfather   . Colon cancer Neg Hx   . Rectal cancer Neg  Hx   . Stomach cancer Neg Hx    Allergies as of 08/04/2020      Reactions   Codeine Nausea Only   Vicodin [hydrocodone-acetaminophen] Nausea Only      Medication List       Accurate as of August 04, 2020  4:19 PM. If you have any questions, ask your nurse or doctor.        Diethylpropion HCl CR 75 MG Tb24 Take 1 tablet by mouth daily.   ezetimibe 10 MG tablet Commonly known as: Zetia Take 1 tablet (10 mg total) by mouth daily.   valACYclovir 1000 MG tablet Commonly known as: VALTREX Take 1 tablet (1,000 mg total) by mouth 3 (three) times daily for 7 days.   Vitamin D (Cholecalciferol) 25 MCG (1000 UT) Caps Take 1 capsule by mouth daily.       All past medical history, surgical history,  allergies, family history, immunizations andmedications were updated in the EMR today and reviewed under the history and medication portions of their EMR.     ROS: Negative, with the exception of above mentioned in HPI   Objective:  BP 129/78   Pulse 85   Temp 98.7 F (37.1 C) (Oral)   Ht 5' 5.75" (1.67 m)   Wt 165 lb (74.8 kg)   SpO2 100%   BMI 26.83 kg/m  Body mass index is 26.83 kg/m. Gen: Afebrile. No acute distress. Nontoxic in appearance, well developed, well nourished.  HENT: AT. [place].  Skin: Red based vesicular rash around T4 dermatome, no drainage or cellulitis features.  X3 mildly raised red areas left hip, no purpura or petechiae.  Neuro: Normal gait. PERLA. EOMi. Alert. Oriented x3  No exam data present No results found. No results found for this or any previous visit (from the past 24 hour(s)).  Assessment/Plan: Michelle Ochoa is a 59 y.o. female present for OV for  Herpes zoster without complication X3 new areas look like bug bites.  They do note appear to be shingles and they are not even close to the same dermatome.  They are also not painful itchy or burning. Extended Valtrex for an additional 5 days. Printed gabapentin prescription to be used gabapentin 100 mg nightly if burning/nerve like pain continues. Encouraged her to pick up OTC lidocaine patches for relief. Follow-up as needed  Briefly reviewed her most recent labs with elevated LDL of 186.  She had elected to start Zetia and avoid statins however Zetia is not approved by her insurance.  She is agreeable today to start statin.  Pravastatin prescribed.  Follow-up in 3 months  Reviewed expectations re: course of current medical issues.  Discussed self-management of symptoms.  Outlined signs and symptoms indicating need for more acute intervention.  Patient verbalized understanding and all questions were answered.  Patient received an After-Visit Summary.    No orders of the defined types were  placed in this encounter.  No orders of the defined types were placed in this encounter.  Referral Orders  No referral(s) requested today     Note is dictated utilizing voice recognition software. Although note has been proof read prior to signing, occasional typographical errors still can be missed. If any questions arise, please do not hesitate to call for verification.   electronically signed by:  Howard Pouch, DO  [place]

## 2020-08-05 ENCOUNTER — Telehealth: Payer: Self-pay

## 2020-08-05 NOTE — Telephone Encounter (Signed)
Alternative already discussed with patient and prescribed at her appt yesterday.

## 2020-08-05 NOTE — Telephone Encounter (Signed)
Medication ezetimibe 10 mg PA was denied. Alternative medication needed for this medication.

## 2020-08-29 ENCOUNTER — Other Ambulatory Visit: Payer: Self-pay

## 2020-08-29 MED ORDER — GABAPENTIN 100 MG PO CAPS: 200.0000 mg | ORAL_CAPSULE | Freq: Two times a day (BID) | ORAL | 1 refills | Status: DC

## 2020-08-29 NOTE — Telephone Encounter (Signed)
Spoke with pt and informed her of new rx instructions. Pt states the gabapentin did not help in the beginning but is willing to try increased dose

## 2020-08-29 NOTE — Addendum Note (Signed)
Addended by: Kavin Leech on: 08/29/2020 04:02 PM   Modules accepted: Orders

## 2020-08-29 NOTE — Addendum Note (Signed)
Addended by: Howard Pouch A on: 08/29/2020 03:33 PM   Modules accepted: Orders

## 2020-08-29 NOTE — Telephone Encounter (Signed)
Unfortunately, some patients do have long term neuropathic pain from shingles.  I have called in more gabapentin at increased dose and frequency. Increase gabapentin to 2 tabs every 12 hours. This is the type of medication that will help with the nerve pain. The other meds would not be helpful to repeat.  Follow up in 2 weeks if increased dose still not helpful.

## 2020-08-29 NOTE — Telephone Encounter (Signed)
Still having some pain from shingles, she is unsure if she needs another round of meds. Would like to speak to someone as soon as they can, so if we have to schedule an appt for this afternoon, we can schedule her with Dr. Raoul Pitch.  Please call [phone]  Thank you

## 2020-08-29 NOTE — Telephone Encounter (Signed)
Please advise if pt needs appt.

## 2020-10-27 ENCOUNTER — Telehealth: Payer: Self-pay | Admitting: Family Medicine

## 2020-10-27 NOTE — Telephone Encounter (Signed)
Please advise 

## 2020-10-27 NOTE — Telephone Encounter (Signed)
Patient had shingles three months ago and still having some lingering nerve pain, but not enough that she thinks she needs treatment. She had the first two Moderna Covid vaccines 10 months ago. Patient is wondering if it is safe for her to have the Covid booster, worried it may reactivate the shingles. Please call patient to advise.

## 2020-10-27 NOTE — Telephone Encounter (Signed)
Spoke with pt regarding following message 

## 2020-10-27 NOTE — Telephone Encounter (Signed)
Covid could reactivate her shingles also. Stress could reactivate shingles. There is no one to know for certain what will or will not.  Benefits vs risk, I would encourage her to consider getting her booster shot.

## 2021-01-05 ENCOUNTER — Other Ambulatory Visit: Payer: Self-pay

## 2021-01-06 ENCOUNTER — Encounter: Payer: Self-pay | Admitting: Family Medicine

## 2021-01-06 ENCOUNTER — Ambulatory Visit: Payer: BC Managed Care – PPO | Admitting: Family Medicine

## 2021-01-06 ENCOUNTER — Telehealth: Payer: Self-pay | Admitting: Family Medicine

## 2021-01-06 VITALS — BP 104/69 | HR 56 | Temp 98.4°F | Ht 66.0 in | Wt 170.0 lb

## 2021-01-06 DIAGNOSIS — E782 Mixed hyperlipidemia: Secondary | ICD-10-CM | POA: Diagnosis not present

## 2021-01-06 DIAGNOSIS — R7309 Other abnormal glucose: Secondary | ICD-10-CM

## 2021-01-06 DIAGNOSIS — Z713 Dietary counseling and surveillance: Secondary | ICD-10-CM

## 2021-01-06 DIAGNOSIS — Z5320 Procedure and treatment not carried out because of patient's decision for unspecified reasons: Secondary | ICD-10-CM

## 2021-01-06 DIAGNOSIS — E663 Overweight: Secondary | ICD-10-CM

## 2021-01-06 MED ORDER — NALTREXONE HCL 50 MG PO TABS: ORAL_TABLET | ORAL | 5 refills | Status: DC

## 2021-01-06 MED ORDER — BUPROPION HCL ER (XL) 150 MG PO TB24: 150.0000 mg | ORAL_TABLET | Freq: Every day | ORAL | 5 refills | Status: DC

## 2021-01-06 NOTE — Telephone Encounter (Signed)
Spoke with pt and informed her of provider's instructions.

## 2021-01-06 NOTE — Telephone Encounter (Signed)
Please inform pt I have called in the Wellbutrin one tab daily and depade 1/2 tab for 7 days, then increase to 1/2 tab twice daily before meals.  Follow up in 6-8 weeks on weight loss

## 2021-01-06 NOTE — Patient Instructions (Signed)
I will call in the depade and Wellbutrin today around lunch.  Increase exercise as much as tolerated.     Preventing High Cholesterol Cholesterol is a white, waxy substance similar to fat that the human body needs to help build cells. The liver makes all the cholesterol that a person's body needs. Having high cholesterol (hypercholesterolemia) increases your risk for heart disease and stroke. Extra or excess cholesterol comes from the food that you eat. High cholesterol can often be prevented with diet and lifestyle changes. If you already have high cholesterol, you can control it with diet, lifestyle changes, and medicines. How can high cholesterol affect me? If you have high cholesterol, fatty deposits (plaques) may build up on the [place] of your blood vessels. The blood vessels that carry blood away from your heart are called arteries. Plaques make the arteries narrower and stiffer. This in turn can:  Restrict or block blood flow and cause blood clots to form.  Increase your risk for heart attack and stroke. What can increase my risk for high cholesterol? This condition is more likely to develop in people who:  Eat foods that are high in saturated fat or cholesterol. Saturated fat is mostly found in foods that come from animal sources.  Are overweight.  Are not getting enough exercise.  Have a family history of high cholesterol (familial hypercholesterolemia). What actions can I take to prevent this? Nutrition  Eat less saturated fat.  Avoid trans fats (partially hydrogenated oils). These are often found in margarine and in some baked goods, fried foods, and snacks bought in packages.  Avoid precooked or cured meat, such as bacon, sausages, or meat loaves.  Avoid foods and drinks that have added sugars.  Eat more fruits, vegetables, and whole grains.  Choose healthy sources of protein, such as fish, poultry, lean cuts of red meat, beans, peas, lentils, and nuts.  Choose  healthy sources of fat, such as: ? Nuts. ? Vegetable oils, especially olive oil. ? Fish that have healthy fats, such as omega-3 fatty acids. These fish include mackerel or salmon.   Lifestyle  Lose weight if you are overweight. Maintaining a healthy body mass index (BMI) can help prevent or control high cholesterol. It can also lower your risk for diabetes and high blood pressure. Ask your health care provider to help you with a diet and exercise plan to lose weight safely.  Do not use any products that contain nicotine or tobacco, such as cigarettes, e-cigarettes, and chewing tobacco. If you need help quitting, ask your health care provider. Alcohol use  Do not drink alcohol if: ? Your health care provider tells you not to drink. ? You are pregnant, may be pregnant, or are planning to become pregnant.  If you drink alcohol: ? Limit how much you use to:  0-1 drink a day for women.  0-2 drinks a day for men. ? Be aware of how much alcohol is in your drink. In the [place] one drink equals one 12 oz bottle of beer (355 mL), one 5 oz glass of wine (148 mL), or one 1 oz glass of hard liquor (44 mL). Activity  Get enough exercise. Do exercises as told by your health care provider.  Each week, do at least 150 minutes of exercise that takes a medium level of effort (moderate-intensity exercise). This kind of exercise: ? Makes your heart beat faster while allowing you to still be able to talk. ? Can be done in short sessions several times a  day or longer sessions a few times a week. For example, on 5 days each week, you could walk fast or ride your bike 3 times a day for 10 minutes each time.   Medicines  Your health care provider may recommend medicines to help lower cholesterol. This may be a medicine to lower the amount of cholesterol that your liver makes. You may need medicine if: ? Diet and lifestyle changes have not lowered your cholesterol enough. ? You have high cholesterol and other  risk factors for heart disease or stroke.  Take over-the-counter and prescription medicines only as told by your health care provider. General information  Manage your risk factors for high cholesterol. Talk with your health care provider about all your risk factors and how to lower your risk.  Manage other conditions that you have, such as diabetes or high blood pressure (hypertension).  Have blood tests to check your cholesterol levels at regular points in time as told by your health care provider.  Keep all follow-up visits as told by your health care provider. This is important. Where to find more information  American Heart Association: www.heart.org  National Heart, Lung, and Blood Institute: https://wilson-eaton.com/ Summary  High cholesterol increases your risk for heart disease and stroke. By keeping your cholesterol level low, you can reduce your risk for these conditions.  High cholesterol can often be prevented with diet and lifestyle changes.  Work with your health care provider to manage your risk factors, and have your blood tested regularly. This information is not intended to replace advice given to you by your health care provider. Make sure you discuss any questions you have with your health care provider. Document Revised: 07/17/2019 Document Reviewed: 07/17/2019 Elsevier Patient Education  [place].

## 2021-01-06 NOTE — Progress Notes (Signed)
This visit occurred during the SARS-CoV-2 public health emergency.  Safety protocols were in place, including screening questions prior to the visit, additional usage of staff PPE, and extensive cleaning of exam room while observing appropriate contact time as indicated for disinfecting solutions.    Patient ID: Michelle Ochoa, female  DOB: [birth year], 60 y.o.   MRN: [id number] Patient Care Team    Relationship Specialty Notifications Start End  Ma Hillock, DO PCP - General Family Medicine  05/20/16   Dian Queen, MD Consulting Physician Obstetrics and Gynecology  05/20/16   Doran Stabler, MD Consulting Physician Gastroenterology  02/04/20   Lyndal Pulley, [place] Physician Sports Medicine  02/04/20   Star Age, MD Attending Physician Neurology  02/04/20     Chief Complaint  Patient presents with  . Follow-up    HLD   . Weight Loss    Would like to talk about weight loss meds    Subjective: Michelle Ochoa is a 60 y.o.  Female  present for hyperlipidemia follow up and would like to also discuss weight loss.  Hyperlipidemia/overweight/weight loss counseling. Pt has yet to start pravastatin. She has concerns over dementia and Side effects from med. She would like to continue to try a "natural" approach. She has gained 6 lbs since the last visit. She states she had changed her diet (prior) but LDL remained the same > 180. She had been prescribed a stimulant by her gyn, for weight loss, but had side effects from medication. She would like to be prescribed a medication to help with her weight loss. She reports she craves sugary foods. She is unable to exercise 2/2 to [facility] complaints.  Prior note:  Pt reports she has increased her exercise and made some dietary changes. She stopped the keto diet and cut back on bacon, eggs and pimento cheese. She has lost 8 lbs. She is prescribed a dietary stimulant by her gynecologist. She complains of difficulty sleeping (presnet before  stimulant). She does take a melatonin gummy nightly and prefers not to start prescribed medication. She will occassionally take a benadryl to help her sleep. She declined a statin start after last visit with a LDL of 181.   Vit d def: She is taking 1000-2000 otc u a day. Last levels were 25 and pt encouraged to increase to 2000u.    Depression screen [facility] 2/9 07/22/2020 02/04/2020 11/02/2017 05/25/2016  Decreased Interest 0 0 0 0  Down, Depressed, Hopeless 0 0 0 0  PHQ - 2 Score 0 0 0 0   No flowsheet data found.   Immunization History  Administered Date(s) Administered  . Influenza,inj,Quad PF,6+ Mos 07/18/2016, 11/02/2017, 10/25/2018, 07/22/2020  . Influenza-Unspecified 07/18/2016, 11/02/2017, 10/25/2018  . Moderna Sars-Covid-2 Vaccination 11/18/2019, 12/16/2019  . PFIZER Comirnaty(Gray Top)Covid-19 Tri-Sucrose Vaccine 12/02/2020  . Tdap 06/08/2016    Past Medical History:  Diagnosis Date  . Anemia    with 1st childbirth  . Arthritis   . Basal cell carcinoma   . Chicken pox   . Headache   . History of shingles 07/2020  . Insomnia   . PONV (postoperative nausea and vomiting)   . Toe fracture, right 2012   rt great toe  . UTI (lower urinary tract infection)    Allergies  Allergen Reactions  . Codeine Nausea Only  . Vicodin [Hydrocodone-Acetaminophen] Nausea Only   Past Surgical History:  Procedure Laterality Date  . APPENDECTOMY  2005  . CESAREAN SECTION  [phone]  .  LAPAROSCOPIC ABDOMINAL EXPLORATION     Family History  Problem Relation Age of Onset  . Breast cancer Mother        38  . Dementia Mother   . Prostate cancer Father   . Colon polyps Father   . Diabetes Paternal Uncle   . Heart disease Paternal Uncle   . Early death Paternal Uncle   . Depression Maternal Grandmother   . Diabetes Maternal Grandfather   . Diabetes Paternal Grandfather   . Colon cancer Neg Hx   . Rectal cancer Neg Hx   . Stomach cancer Neg Hx    Social History   Social  History Narrative   Married, [place] 3 adult children [name], [place], [place]).   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    Takes daily vitamin   Wears seatbelt   exercises routinely.    Smoke detector in the home.    Feels safe in her relationships.     Allergies as of 01/06/2021      Reactions   Codeine Nausea Only   Vicodin [hydrocodone-acetaminophen] Nausea Only      Medication List       Accurate as of January 06, 2021 12:33 PM. If you have any questions, ask your nurse or doctor.        STOP taking these medications   Diethylpropion HCl CR 75 MG Tb24 Stopped by: Howard Pouch, DO     TAKE these medications   buPROPion 150 MG 24 hr tablet Commonly known as: WELLBUTRIN XL Take 1 tablet (150 mg total) by mouth daily. Started by: Howard Pouch, DO   gabapentin 100 MG capsule Commonly known as: NEURONTIN Take 2 capsules (200 mg total) by mouth 2 (two) times daily.   naltrexone 50 MG tablet Commonly known as: DEPADE 1/2 tab PO daily for 7 days,  Then increase to 1/2 tab PO  BID Started by: Howard Pouch, DO   pravastatin 20 MG tablet Commonly known as: PRAVACHOL Take 1 tablet (20 mg total) by mouth daily.   Vitamin D (Cholecalciferol) 25 MCG (1000 UT) Caps Take 1 capsule by mouth daily.       All past medical history, surgical history, allergies, family history, immunizations andmedications were updated in the EMR today and reviewed under the history and medication portions of their EMR.      MR Cervical Spine Wo Contrast Result Date: 08/21/2018  IMPRESSION: Small broad-based disc bulge at C5-6 without neural impingement. Otherwise, essentially normal MRI of the cervical spine.   ROS: 14 pt review of systems performed and negative (unless mentioned in an HPI)  Objective: BP 104/69   Pulse (!) 56   Temp 98.4 F (36.9 C) (Oral)   Ht 5\' 6"  (1.676 m)   Wt 170 lb (77.1 kg)   SpO2 98%   BMI 27.44 kg/m  Gen: Afebrile. No acute  distress. No toxic . Female overweight.  HENT: AT. [place].  Eyes:Pupils Equal Round Reactive to light, Extraocular movements intact,  Conjunctiva without redness, discharge or icterus. Neuro:  Normal gait. PERLA. EOMi. Alert. Oriented x3 Psych: Normal affect, dress and demeanor. Normal speech. Normal thought content and judgment.  No exam data present  Assessment/plan: Michelle Ochoa is a 60 y.o. female present for CPE Overweight/hyperlipidemia/elevated a1c/weight loss counseling Pt has gained back 6 lbs and not exercising d/t msk complaints.  She has not started pravastatin. She would like to lower cholesterol more "naturally" because she has concerns over dementia with  statin.   She has been counseled extensively on safety and benefits of statin group.  LDL >180. Zetia was declined by insurance without trial of statin. Discussed "other" classes today for lowering cholesterol and she wants to focus on weight management.  She would like to start a weight loss medication.  Discussed Wegovy/ozempic and Wellbutrin/depade. She reports she does crave sugar snacks Start wellbutrin 150 mg XL QD (formulary) and depade taper to 1/2 tab BID before meals.  Could consider adding metformin as well, at follow up. Unlikely would be able to coverage ozempic. Wegovy not on formulary and too expensive.     Pt also complaining of msk complaints today- unable to cover all her concerns in one visit. Encouraged her to make an appt to discuss. She is also established with [facility].   Return in about 7 weeks (around 02/24/2021) for weight loss.   No orders of the defined types were placed in this encounter.  Meds ordered this encounter  Medications  . buPROPion (WELLBUTRIN XL) 150 MG 24 hr tablet    Sig: Take 1 tablet (150 mg total) by mouth daily.    Dispense:  30 tablet    Refill:  5  . naltrexone (DEPADE) 50 MG tablet    Sig: 1/2 tab PO daily for 7 days,  Then increase to 1/2 tab PO  BID    Dispense:  60 tablet     Refill:  5   Referrals: Neurology referral placed today  Electronically signed by: Howard Pouch, [place]

## 2021-01-29 DIAGNOSIS — H40013 Open angle with borderline findings, low risk, bilateral: Secondary | ICD-10-CM | POA: Diagnosis not present

## 2021-04-01 ENCOUNTER — Other Ambulatory Visit: Payer: Self-pay

## 2021-04-01 MED ORDER — BUPROPION HCL ER (XL) 150 MG PO TB24: 150.0000 mg | ORAL_TABLET | Freq: Every day | ORAL | 0 refills | Status: DC

## 2021-04-07 DIAGNOSIS — Z01419 Encounter for gynecological examination (general) (routine) without abnormal findings: Secondary | ICD-10-CM | POA: Diagnosis not present

## 2021-04-07 DIAGNOSIS — N951 Menopausal and female climacteric states: Secondary | ICD-10-CM | POA: Diagnosis not present

## 2021-04-07 DIAGNOSIS — Z6827 Body mass index (BMI) 27.0-27.9, adult: Secondary | ICD-10-CM | POA: Diagnosis not present

## 2021-04-07 DIAGNOSIS — Z1231 Encounter for screening mammogram for malignant neoplasm of breast: Secondary | ICD-10-CM | POA: Diagnosis not present

## 2021-04-07 LAB — HM PAP SMEAR

## 2021-04-08 LAB — HM MAMMOGRAPHY

## 2021-04-09 LAB — HM PAP SMEAR: Pap: NEGATIVE

## 2021-04-30 ENCOUNTER — Encounter: Payer: Self-pay | Admitting: Family Medicine

## 2021-04-30 ENCOUNTER — Other Ambulatory Visit: Payer: Self-pay

## 2021-04-30 ENCOUNTER — Ambulatory Visit: Payer: BC Managed Care – PPO | Admitting: Family Medicine

## 2021-04-30 VITALS — BP 104/68 | HR 67 | Temp 98.1°F | Ht 66.0 in | Wt 170.0 lb

## 2021-04-30 DIAGNOSIS — Z713 Dietary counseling and surveillance: Secondary | ICD-10-CM

## 2021-04-30 DIAGNOSIS — E559 Vitamin D deficiency, unspecified: Secondary | ICD-10-CM | POA: Diagnosis not present

## 2021-04-30 DIAGNOSIS — E663 Overweight: Secondary | ICD-10-CM | POA: Diagnosis not present

## 2021-04-30 DIAGNOSIS — E782 Mixed hyperlipidemia: Secondary | ICD-10-CM

## 2021-04-30 DIAGNOSIS — F419 Anxiety disorder, unspecified: Secondary | ICD-10-CM

## 2021-04-30 LAB — LDL CHOLESTEROL, DIRECT: Direct LDL: 129 mg/dL

## 2021-04-30 LAB — VITAMIN D 25 HYDROXY (VIT D DEFICIENCY, FRACTURES): VITD: 36.19 ng/mL (ref 30.00–100.00)

## 2021-04-30 MED ORDER — BUPROPION HCL ER (XL) 150 MG PO TB24: 150.0000 mg | ORAL_TABLET | Freq: Every day | ORAL | 1 refills | Status: DC

## 2021-04-30 NOTE — Progress Notes (Signed)
This visit occurred during the SARS-CoV-2 public health emergency.  Safety protocols were in place, including screening questions prior to the visit, additional usage of staff PPE, and extensive cleaning of exam room while observing appropriate contact time as indicated for disinfecting solutions.    Patient ID: Michelle Ochoa, female  DOB: [birth year], 60 y.o.   MRN: [id number] Patient Care Team    Relationship Specialty Notifications Start End  Ma Hillock, DO PCP - General Family Medicine  05/20/16   Dian Queen, MD Consulting Physician Obstetrics and Gynecology  05/20/16   Doran Stabler, MD Consulting Physician Gastroenterology  02/04/20   Lyndal Pulley, [place] Physician Sports Medicine  02/04/20   Star Age, MD Attending Physician Neurology  02/04/20     Chief Complaint  Patient presents with   Weight Loss    Pt is not fasting; pt is currently working out daily; struggling with diet    Subjective: [name] is a 60 y.o.  Female  present for weight loss counseling  Start weight/date:170 lbs/01/06/2021 Last weight/date: per above Today's weight: 170 lbs  Patient reports she was unable to tolerate the Depade.  She does feel like the Wellbutrin has been helpful with her attention and her mood.  She is still craving sugary snacks, now both in the afternoon and evening.  She reports she is not exercising routinely.  She is having difficulty with motivation.  Depression screen [facility] 2/9 07/22/2020 02/04/2020 11/02/2017 05/25/2016  Decreased Interest 0 0 0 0  Down, Depressed, Hopeless 0 0 0 0  PHQ - 2 Score 0 0 0 0   No flowsheet data found.   Immunization History  Administered Date(s) Administered   Influenza,inj,Quad PF,6+ Mos 07/18/2016, 11/02/2017, 10/25/2018, 07/22/2020   Influenza-Unspecified 07/18/2016, 11/02/2017, 10/25/2018   Moderna Sars-Covid-2 Vaccination 11/18/2019, 12/16/2019   PFIZER Comirnaty(Gray Top)Covid-19 Tri-Sucrose Vaccine 12/02/2020    Tdap 06/08/2016    Past Medical History:  Diagnosis Date   Anemia    with 1st childbirth   Arthritis    Basal cell carcinoma    Chicken pox    Headache    History of shingles 07/2020   Insomnia    PONV (postoperative nausea and vomiting)    Toe fracture, right 2012   rt great toe   UTI (lower urinary tract infection)    Allergies  Allergen Reactions   Codeine Nausea Only   Vicodin [Hydrocodone-Acetaminophen] Nausea Only   Past Surgical History:  Procedure Laterality Date   APPENDECTOMY  2005   CESAREAN SECTION  [phone]   LAPAROSCOPIC ABDOMINAL EXPLORATION     Family History  Problem Relation Age of Onset   Breast cancer Mother        21   Dementia Mother    Prostate cancer Father    Colon polyps Father    Diabetes Paternal Uncle    Heart disease Paternal Uncle    Early death Paternal Uncle    Depression Maternal Grandmother    Diabetes Maternal Grandfather    Diabetes Paternal Grandfather    Colon cancer Neg Hx    Rectal cancer Neg Hx    Stomach cancer Neg Hx    Social History   Social History Narrative   [place], [place] 3 adult children Michelle Ochoa, [place], Michelle Ochoa).   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    Takes daily vitamin   Wears seatbelt   exercises routinely.    Smoke detector in the home.  Feels safe in her relationships.     Allergies as of 04/30/2021       Reactions   Codeine Nausea Only   Vicodin [hydrocodone-acetaminophen] Nausea Only        Medication List        Accurate as of April 30, 2021 11:59 PM. If you have any questions, ask your nurse or doctor.          STOP taking these medications    gabapentin 100 MG capsule Commonly known as: NEURONTIN Stopped by: Howard Pouch, DO   naltrexone 50 MG tablet Commonly known as: DEPADE Stopped by: Howard Pouch, DO   pravastatin 20 MG tablet Commonly known as: PRAVACHOL Stopped by: Howard Pouch, DO       TAKE these medications     buPROPion 150 MG 24 hr tablet Commonly known as: WELLBUTRIN XL Take 1 tablet (150 mg total) by mouth daily.   estradiol 0.05 MG/24HR patch Commonly known as: VIVELLE-DOT 1 patch 2 (two) times a week.   progesterone 100 MG capsule Commonly known as: PROMETRIUM progesterone micronized 100 mg capsule  TAKE 1 CAPSULE BY MOUTH EVERY DAY   Vitamin D (Cholecalciferol) 25 MCG (1000 UT) Caps Take 1 capsule by mouth daily.        All past medical history, surgical history, allergies, family history, immunizations andmedications were updated in the EMR today and reviewed under the history and medication portions of their EMR.      MR Cervical Spine Wo Contrast Result Date: 08/21/2018  IMPRESSION: Small broad-based disc bulge at C5-6 without neural impingement. Otherwise, essentially normal MRI of the cervical spine.   ROS: 14 pt review of systems performed and negative (unless mentioned in an HPI)  Objective: BP 104/68   Pulse 67   Temp 98.1 F (36.7 C) (Oral)   Ht 5\' 6"  (1.676 m)   Wt 170 lb (77.1 kg)   SpO2 99%   BMI 27.44 kg/m  Gen: Afebrile. No acute distress.  Nontoxic, pleasant overweight female HENT: AT. [place].  Eyes:Pupils Equal Round Reactive to light, Extraocular movements intact,  Conjunctiva without redness, discharge or icterus. CV: RRR  Chest: CTAB Neuro:  Normal gait. PERLA. EOMi. Alert. Oriented x3 Psych: Normal affect, dress and demeanor. Normal speech. Normal thought content and judgment.    No results found.  Assessment/plan: [name] is a 60 y.o. female present for  Overweight/weight loss counseling/anxiety She is seeing some benefit in her anxiety and attention with the Wellbutrin. Continue Wellbutrin 150 mg XL daily We discontinued the Depade, she was unable to tolerate. She needs to focus on getting back into the exercise regimen.  She is unmotivated at this time but is working on it. Avoid sugary snacks.  Encouraged her to find snack  replacements that are healthier options, that she can enjoy.  Hyperlipidemia: Patient has changed her diet and has been watching her saturated fats. LDL collected today She had declined start of pravastatin.    No follow-ups on file.   Orders Placed This Encounter  Procedures   Direct LDL   Vitamin D (25 hydroxy)    Meds ordered this encounter  Medications   buPROPion (WELLBUTRIN XL) 150 MG 24 hr tablet    Sig: Take 1 tablet (150 mg total) by mouth daily.    Dispense:  90 tablet    Refill:  1   DISCONTD: pravastatin (PRAVACHOL) 20 MG tablet    Sig: Take 1 tablet (20 mg total) by mouth daily.  Dispense:  90 tablet    Refill:  3     Electronically signed by: Howard Pouch, DO [place]

## 2021-04-30 NOTE — Patient Instructions (Signed)
Great to see you today.  I have refilled the medication(s) we provide.   If labs were collected, we will inform you of lab results once received either by echart message or telephone call.   - echart message- for normal results that have been seen by the patient already.   - telephone call: abnormal results or if patient has not viewed results in their echart.   Preventing High Cholesterol Cholesterol is a white, waxy substance similar to fat that the human body needs to help build cells. The liver makes all the cholesterol that a person's body needs. Having high cholesterol (hypercholesterolemia) increases your risk for heart disease and stroke. Extra or excess cholesterolcomes from the food that you eat. High cholesterol can often be prevented with diet and lifestyle changes. If you already have high cholesterol, you can control it with diet, lifestyle changes,and medicines. How can high cholesterol affect me? If you have high cholesterol, fatty deposits (plaques) may build up on the [place] of your blood vessels. The blood vessels that carry blood away from your heart are called arteries. Plaques make the arteries narrower and stiffer. This in turn can: Restrict or block blood flow and cause blood clots to form. Increase your risk for heart attack and stroke. What can increase my risk for high cholesterol? This condition is more likely to develop in people who: Eat foods that are high in saturated fat or cholesterol. Saturated fat is mostly found in foods that come from animal sources. Are overweight. Are not getting enough exercise. Have a family history of high cholesterol (familial hypercholesterolemia). What actions can I take to prevent this? Nutrition  Eat less saturated fat. Avoid trans fats (partially hydrogenated oils). These are often found in margarine and in some baked goods, fried foods, and snacks bought in packages. Avoid precooked or cured meat, such as bacon, sausages, or  meat loaves. Avoid foods and drinks that have added sugars. Eat more fruits, vegetables, and whole grains. Choose healthy sources of protein, such as fish, poultry, lean cuts of red meat, beans, peas, lentils, and nuts. Choose healthy sources of fat, such as: Nuts. Vegetable oils, especially olive oil. Fish that have healthy fats, such as omega-3 fatty acids. These fish include mackerel or salmon.  Lifestyle Lose weight if you are overweight. Maintaining a healthy body mass index (BMI) can help prevent or control high cholesterol. It can also lower your risk for diabetes and high blood pressure. Ask your health care provider to help you with a diet and exercise plan to lose weight safely. Do not use any products that contain nicotine or tobacco, such as cigarettes, e-cigarettes, and chewing tobacco. If you need help quitting, ask your health care provider. Alcohol use Do not drink alcohol if: Your health care provider tells you not to drink. You are pregnant, may be pregnant, or are planning to become pregnant. If you drink alcohol: Limit how much you use to: 0-1 drink a day for women. 0-2 drinks a day for men. Be aware of how much alcohol is in your drink. In the [place] one drink equals one 12 oz bottle of beer (355 mL), one 5 oz glass of wine (148 mL), or one 1 oz glass of hard liquor (44 mL). Activity  Get enough exercise. Do exercises as told by your health care provider. Each week, do at least 150 minutes of exercise that takes a medium level of effort (moderate-intensity exercise). This kind of exercise: Makes your heart beat faster while   allowing you to still be able to talk. Can be done in short sessions several times a day or longer sessions a few times a week. For example, on 5 days each week, you could walk fast or ride your bike 3 times a day for 10 minutes each time.  Medicines Your health care provider may recommend medicines to help lower cholesterol. This may be a  medicine to lower the amount of cholesterol that your liver makes. You may need medicine if: Diet and lifestyle changes have not lowered your cholesterol enough. You have high cholesterol and other risk factors for heart disease or stroke. Take over-the-counter and prescription medicines only as told by your health care provider. General information Manage your risk factors for high cholesterol. Talk with your health care provider about all your risk factors and how to lower your risk. Manage other conditions that you have, such as diabetes or high blood pressure (hypertension). Have blood tests to check your cholesterol levels at regular points in time as told by your health care provider. Keep all follow-up visits as told by your health care provider. This is important. Where to find more information American Heart Association: www.heart.org National Heart, Lung, and Blood Institute: www.nhlbi.nih.gov Summary High cholesterol increases your risk for heart disease and stroke. By keeping your cholesterol level low, you can reduce your risk for these conditions. High cholesterol can often be prevented with diet and lifestyle changes. Work with your health care provider to manage your risk factors, and have your blood tested regularly. This information is not intended to replace advice given to you by your health care provider. Make sure you discuss any questions you have with your healthcare provider. Document Revised: 07/17/2019 Document Reviewed: 07/17/2019 Elsevier Patient Education  2022 Elsevier Inc.  

## 2021-05-01 ENCOUNTER — Encounter: Payer: Self-pay | Admitting: Family Medicine

## 2021-05-01 MED ORDER — PRAVASTATIN SODIUM 20 MG PO TABS: 20.0000 mg | ORAL_TABLET | Freq: Every day | ORAL | 3 refills | Status: DC

## 2021-05-20 DIAGNOSIS — Z6827 Body mass index (BMI) 27.0-27.9, adult: Secondary | ICD-10-CM | POA: Diagnosis not present

## 2021-05-20 DIAGNOSIS — R635 Abnormal weight gain: Secondary | ICD-10-CM | POA: Diagnosis not present

## 2021-05-20 DIAGNOSIS — N951 Menopausal and female climacteric states: Secondary | ICD-10-CM | POA: Diagnosis not present

## 2021-06-08 DIAGNOSIS — L905 Scar conditions and fibrosis of skin: Secondary | ICD-10-CM | POA: Diagnosis not present

## 2021-08-06 DIAGNOSIS — D485 Neoplasm of uncertain behavior of skin: Secondary | ICD-10-CM | POA: Diagnosis not present

## 2021-08-06 DIAGNOSIS — L570 Actinic keratosis: Secondary | ICD-10-CM | POA: Diagnosis not present

## 2021-08-06 DIAGNOSIS — L738 Other specified follicular disorders: Secondary | ICD-10-CM | POA: Diagnosis not present

## 2021-08-26 DIAGNOSIS — L738 Other specified follicular disorders: Secondary | ICD-10-CM | POA: Diagnosis not present

## 2021-08-26 DIAGNOSIS — Z23 Encounter for immunization: Secondary | ICD-10-CM | POA: Diagnosis not present

## 2021-08-26 DIAGNOSIS — L570 Actinic keratosis: Secondary | ICD-10-CM | POA: Diagnosis not present

## 2021-08-27 DIAGNOSIS — L905 Scar conditions and fibrosis of skin: Secondary | ICD-10-CM | POA: Diagnosis not present

## 2021-10-29 DIAGNOSIS — H40013 Open angle with borderline findings, low risk, bilateral: Secondary | ICD-10-CM | POA: Diagnosis not present

## 2021-11-26 ENCOUNTER — Ambulatory Visit: Payer: BC Managed Care – PPO | Admitting: Family Medicine

## 2021-11-26 ENCOUNTER — Other Ambulatory Visit: Payer: Self-pay

## 2021-11-26 ENCOUNTER — Encounter: Payer: Self-pay | Admitting: Family Medicine

## 2021-11-26 VITALS — BP 129/66 | HR 67 | Temp 98.8°F | Ht 66.0 in | Wt 174.0 lb

## 2021-11-26 DIAGNOSIS — F419 Anxiety disorder, unspecified: Secondary | ICD-10-CM

## 2021-11-26 DIAGNOSIS — R41840 Attention and concentration deficit: Secondary | ICD-10-CM | POA: Diagnosis not present

## 2021-11-26 MED ORDER — BUPROPION HCL ER (XL) 300 MG PO TB24: 300.0000 mg | ORAL_TABLET | Freq: Every day | ORAL | 1 refills | Status: DC

## 2021-11-26 MED ORDER — DIAZEPAM 5 MG PO TABS: 2.5000 mg | ORAL_TABLET | Freq: Two times a day (BID) | ORAL | 1 refills | Status: DC | PRN

## 2021-11-26 NOTE — Patient Instructions (Addendum)
°  Start wellbutrin 300 mg qd.  Valium 1/2 to 1 tab when needed.

## 2021-11-26 NOTE — Progress Notes (Signed)
This visit occurred during the SARS-CoV-2 public health emergency.  Safety protocols were in place, including screening questions prior to the visit, additional usage of staff PPE, and extensive cleaning of exam room while observing appropriate contact time as indicated for disinfecting solutions.    Michelle Ochoa , 05/20/1961, 61 y.o., female MRN: [id number] Patient Care Team    Relationship Specialty Notifications Start End  Ma Hillock, DO PCP - General Family Medicine  05/20/16   Dian Queen, MD Consulting Physician Obstetrics and Gynecology  05/20/16   Doran Stabler, MD Consulting Physician Gastroenterology  02/04/20   Lyndal Pulley, [place] Physician Sports Medicine  02/04/20   Star Age, MD Attending Physician Neurology  02/04/20     Chief Complaint  Patient presents with   Stress    Pt reports increase of stress x 2 weeks;      Subjective: Pt presents for an OV with complaints of increased stress over the last few months.  She is compliant with Wellbutrin 150 mg XL.  She does feel this is helpful for both her mental health and cravings.  This was originally started to help with decreased focus and weight loss.  She states it was working well until a few months ago.  She does endorse quite a bit of stress in her life surrounding family dynamics.  Her mother is in a memory clinic/unit and her father also has dementia.  They are attempting to move her father in their home.  She states that life is just becomes stressful around these issues that she feels like she is "sacrificing my own health.  " Depression screen [facility] 2/9 11/26/2021 07/22/2020 02/04/2020 11/02/2017 05/25/2016  Decreased Interest 0 0 0 0 0  Down, Depressed, Hopeless 1 0 0 0 0  PHQ - 2 Score 1 0 0 0 0  Altered sleeping 3 - - - -  Tired, decreased energy 1 - - - -  Change in appetite 1 - - - -  Feeling bad or failure about yourself  1 - - - -  Trouble concentrating 1 - - - -  Moving slowly or  fidgety/restless 0 - - - -  Suicidal thoughts 0 - - - -  PHQ-9 Score 8 - - - -    Allergies  Allergen Reactions   Codeine Nausea Only   Vicodin [Hydrocodone-Acetaminophen] Nausea Only   Social History   Social History Narrative   Married, [place] 3 adult children [name], [place], [place]).   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    Takes daily vitamin   Wears seatbelt   exercises routinely.    Smoke detector in the home.    Feels safe in her relationships.    Past Medical History:  Diagnosis Date   Anemia    with 1st childbirth   Arthritis    Basal cell carcinoma    Chicken pox    Headache    History of shingles 07/2020   Insomnia    PONV (postoperative nausea and vomiting)    Toe fracture, right 2012   rt great toe   UTI (lower urinary tract infection)    Past Surgical History:  Procedure Laterality Date   APPENDECTOMY  2005   CESAREAN SECTION  [phone]   LAPAROSCOPIC ABDOMINAL EXPLORATION     Family History  Problem Relation Age of Onset   Breast cancer Mother        31  Dementia Mother    Prostate cancer Father    Colon polyps Father    Diabetes Paternal Uncle    Heart disease Paternal Uncle    Early death Paternal Uncle    Depression Maternal Grandmother    Diabetes Maternal Grandfather    Diabetes Paternal Grandfather    Colon cancer Neg Hx    Rectal cancer Neg Hx    Stomach cancer Neg Hx    Allergies as of 11/26/2021       Reactions   Codeine Nausea Only   Vicodin [hydrocodone-acetaminophen] Nausea Only        Medication List        Accurate as of November 26, 2021  1:33 PM. If you have any questions, ask your nurse or doctor.          buPROPion 300 MG 24 hr tablet Commonly known as: WELLBUTRIN XL Take 1 tablet (300 mg total) by mouth daily. What changed:  medication strength how much to take Changed by: Howard Pouch, DO   diazepam 5 MG tablet Commonly known as: VALIUM Take 0.5-1 tablets  (2.5-5 mg total) by mouth every 12 (twelve) hours as needed for anxiety. Started by: Howard Pouch, DO   estradiol 0.05 MG/24HR patch Commonly known as: VIVELLE-DOT 1 patch 2 (two) times a week.   pravastatin 20 MG tablet Commonly known as: PRAVACHOL Take 20 mg by mouth daily.   progesterone 100 MG capsule Commonly known as: PROMETRIUM progesterone micronized 100 mg capsule  TAKE 1 CAPSULE BY MOUTH EVERY DAY   Vitamin D (Cholecalciferol) 25 MCG (1000 UT) Caps Take 1 capsule by mouth daily.        All past medical history, surgical history, allergies, family history, immunizations andmedications were updated in the EMR today and reviewed under the history and medication portions of their EMR.     ROS Negative, with the exception of above mentioned in HPI   Objective:  BP 129/66    Pulse 67    Temp 98.8 F (37.1 C) (Oral)    Ht 5\' 6"  (1.676 m)    Wt 174 lb (78.9 kg)    SpO2 99%    BMI 28.08 kg/m  Body mass index is 28.08 kg/m.  Physical Exam Vitals and nursing note reviewed.  Constitutional:      General: She is not in acute distress.    Appearance: Normal appearance. She is normal weight. She is not ill-appearing or toxic-appearing.  Eyes:     Extraocular Movements: Extraocular movements intact.     Conjunctiva/sclera: Conjunctivae normal.     Pupils: Pupils are equal, round, and reactive to light.  Neurological:     Mental Status: She is alert and oriented to person, place, and time. Mental status is at baseline.  Psychiatric:        Mood and Affect: Mood normal.        Behavior: Behavior normal.        Thought Content: Thought content normal.        Judgment: Judgment normal.    No results found. No results found. No results found for this or any previous visit (from the past 24 hour(s)).  Assessment/Plan: Michelle Ochoa is a 61 y.o. female present for OV for  Anxiety/stress/focus Increasing stress in her life.  Having difficulty falling asleep secondary  to stress. Increase Wellbutrin 150 mg XL > 300 mg XL daily Discussed valium - prescribed if needed only Tried: effexor F/u 5.5 months.- sooner if needed.  Reviewed expectations re: course of current medical issues. Discussed self-management of symptoms. Outlined signs and symptoms indicating need for more acute intervention. Patient verbalized understanding and all questions were answered. Patient received an After-Visit Summary.    No orders of the defined types were placed in this encounter.  Meds ordered this encounter  Medications   buPROPion (WELLBUTRIN XL) 300 MG 24 hr tablet    Sig: Take 1 tablet (300 mg total) by mouth daily.    Dispense:  90 tablet    Refill:  1   diazepam (VALIUM) 5 MG tablet    Sig: Take 0.5-1 tablets (2.5-5 mg total) by mouth every 12 (twelve) hours as needed for anxiety.    Dispense:  60 tablet    Refill:  1   Referral Orders  No referral(s) requested today     Note is dictated utilizing voice recognition software. Although note has been proof read prior to signing, occasional typographical errors still can be missed. If any questions arise, please do not hesitate to call for verification.   electronically signed by:  Howard Pouch, DO  [place]

## 2022-01-04 ENCOUNTER — Encounter: Payer: Self-pay | Admitting: Family Medicine

## 2022-01-04 MED ORDER — BUPROPION HCL ER (XL) 150 MG PO TB24: 150.0000 mg | ORAL_TABLET | Freq: Every day | ORAL | 1 refills | Status: DC

## 2022-01-04 NOTE — Telephone Encounter (Signed)
Sent new prescription, please make sure old prescription was discontinued at the pharmacy. ?

## 2022-01-04 NOTE — Telephone Encounter (Signed)
Please advise if ok to send new script? ?

## 2022-01-11 DIAGNOSIS — L821 Other seborrheic keratosis: Secondary | ICD-10-CM | POA: Diagnosis not present

## 2022-01-11 DIAGNOSIS — L570 Actinic keratosis: Secondary | ICD-10-CM | POA: Diagnosis not present

## 2022-01-11 DIAGNOSIS — I8391 Asymptomatic varicose veins of right lower extremity: Secondary | ICD-10-CM | POA: Diagnosis not present

## 2022-01-11 DIAGNOSIS — D225 Melanocytic nevi of trunk: Secondary | ICD-10-CM | POA: Diagnosis not present

## 2022-01-11 DIAGNOSIS — L814 Other melanin hyperpigmentation: Secondary | ICD-10-CM | POA: Diagnosis not present

## 2022-02-02 ENCOUNTER — Encounter: Payer: Self-pay | Admitting: Family Medicine

## 2022-02-02 ENCOUNTER — Ambulatory Visit: Payer: BC Managed Care – PPO | Admitting: Family Medicine

## 2022-02-02 VITALS — BP 111/70 | HR 65 | Temp 97.6°F | Ht 66.0 in | Wt 174.0 lb

## 2022-02-02 DIAGNOSIS — R7309 Other abnormal glucose: Secondary | ICD-10-CM | POA: Diagnosis not present

## 2022-02-02 DIAGNOSIS — Z6828 Body mass index (BMI) 28.0-28.9, adult: Secondary | ICD-10-CM

## 2022-02-02 DIAGNOSIS — E782 Mixed hyperlipidemia: Secondary | ICD-10-CM

## 2022-02-02 DIAGNOSIS — Z713 Dietary counseling and surveillance: Secondary | ICD-10-CM | POA: Diagnosis not present

## 2022-02-02 DIAGNOSIS — E663 Overweight: Secondary | ICD-10-CM | POA: Diagnosis not present

## 2022-02-02 NOTE — Progress Notes (Signed)
? ? ? ?This visit occurred during the SARS-CoV-2 public health emergency.  Safety protocols were in place, including screening questions prior to the visit, additional usage of staff PPE, and extensive cleaning of exam room while observing appropriate contact time as indicated for disinfecting solutions.  ? ? ?Michelle Ochoa , 10-11-1961, 61 y.o., female ?MRN: [id number] ?Patient Care Team  ?  Relationship Specialty Notifications Start End  ?Ma Hillock, DO PCP - General Family Medicine  05/20/16   ?Dian Queen, MD Consulting Physician Obstetrics and Gynecology  05/20/16   ?Doran Stabler, MD Consulting Physician Gastroenterology  02/04/20   ?Lyndal Pulley, DO Consulting Physician Sports Medicine  02/04/20   ?Star Age, MD Attending Physician Neurology  02/04/20   ? ? ?Chief Complaint  ?Patient presents with  ? Obesity  ?  Pt is not fasting; no food log; has taken phentermine in the past   ? ?  ?Subjective: Pt presents for an OV with concerns over her weight.  He reports she has continued to gain weight over the last few years and has had great difficulty taking the weight off.  She does exercise.  She has tried to watch her diet but seems to still put on weight despite her feeling she eats a healthy diet.  She weighs 174 pounds today and a BMI of 28.08.  She presents to discuss weight loss counseling and is interested in trying a medication to help her with weight loss.  She has tried phentermine in the past. ?She has a history of elevated hemoglobin A1c, vitamin D deficiency, hyperlipidemia. ?She is taking Wellbutrin 150 mg daily.  She is prescribed estradiol patch and progesterone capsules by her gynecology team. ? ? ?  11/26/2021  ?  9:38 AM 07/22/2020  ?  1:34 PM 02/04/2020  ? 10:09 AM 11/02/2017  ?  3:45 PM 05/25/2016  ? 10:27 AM  ?Depression screen PHQ 2/9  ?Decreased Interest 0 0 0 0 0  ?Down, Depressed, Hopeless 1 0 0 0 0  ?PHQ - 2 Score 1 0 0 0 0  ?Altered sleeping 3      ?Tired, decreased energy 1       ?Change in appetite 1      ?Feeling bad or failure about yourself  1      ?Trouble concentrating 1      ?Moving slowly or fidgety/restless 0      ?Suicidal thoughts 0      ?PHQ-9 Score 8      ? ? ?Allergies  ?Allergen Reactions  ? Codeine Nausea Only  ? Vicodin [Hydrocodone-Acetaminophen] Nausea Only  ? ?Social History  ? ?Social History Narrative  ? [place], [place] 3 adult children [name], [place], [place]).  ? 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 Drinks caffeine.   ? Takes daily vitamin  ? Wears seatbelt  ? exercises routinely.   ? Smoke detector in the home.   ? Feels safe in her relationships.   ? ?Past Medical History:  ?Diagnosis Date  ? Anemia   ? with 1st childbirth  ? Arthritis   ? Basal cell carcinoma   ? Chicken pox   ? Headache   ? History of shingles 07/2020  ? Insomnia   ? PONV (postoperative nausea and vomiting)   ? Tick bite 05/25/2016  ? Toe fracture, right 2012  ? rt great toe  ? UTI (lower urinary tract infection)   ? ?Past Surgical History:  ?Procedure Laterality Date  ?  APPENDECTOMY  2005  ? CESAREAN SECTION  [phone]  ? LAPAROSCOPIC ABDOMINAL EXPLORATION    ? ?Family History  ?Problem Relation Age of Onset  ? Breast cancer Mother   ?     63  ? Dementia Mother   ? Prostate cancer Father   ? Colon polyps Father   ? Diabetes Paternal Uncle   ? Heart disease Paternal Uncle   ? Early death Paternal Uncle   ? Depression Maternal Grandmother   ? Diabetes Maternal Grandfather   ? Diabetes Paternal Grandfather   ? Colon cancer Neg Hx   ? Rectal cancer Neg Hx   ? Stomach cancer Neg Hx   ? ?Allergies as of 02/02/2022   ? ?   Reactions  ? Codeine Nausea Only  ? Vicodin [hydrocodone-acetaminophen] Nausea Only  ? ?  ? ?  ?Medication List  ?  ? ?  ? Accurate as of February 02, 2022 11:59 PM. If you have any questions, ask your nurse or doctor.  ?  ?  ? ?  ? ?STOP taking these medications   ? ?diazepam 5 MG tablet ?Commonly known as: VALIUM ?Stopped by: Howard Pouch, DO ?  ? ?  ? ?TAKE  these medications   ? ?buPROPion 150 MG 24 hr tablet ?Commonly known as: WELLBUTRIN XL ?Take 1 tablet (150 mg total) by mouth daily. ?  ?estradiol 0.05 MG/24HR patch ?Commonly known as: VIVELLE-DOT ?1 patch 2 (two) times a week. ?  ?pravastatin 20 MG tablet ?Commonly known as: PRAVACHOL ?Take 20 mg by mouth daily. ?  ?progesterone 100 MG capsule ?Commonly known as: PROMETRIUM ?progesterone micronized 100 mg capsule ? TAKE 1 CAPSULE BY MOUTH EVERY DAY ?  ?Semaglutide-Weight Management 0.25 MG/0.5ML Soaj ?Inject 0.25 mg into the skin once a week. ?Started by: Howard Pouch, DO ?  ?Vitamin D (Cholecalciferol) 25 MCG (1000 UT) Caps ?Take 1 capsule by mouth daily. ?  ? ?  ? ? ?All past medical history, surgical history, allergies, family history, immunizations andmedications were updated in the EMR today and reviewed under the history and medication portions of their EMR.    ? ?ROS ?Negative, with the exception of above mentioned in HPI ? ?Objective:  ?BP 111/70   Pulse 65   Temp 97.6 ?F (36.4 ?C) (Oral)   Ht '5\' 6"'$  (1.676 m)   Wt 174 lb (78.9 kg)   SpO2 97%   BMI 28.08 kg/m?  ?Body mass index is 28.08 kg/m?Marland Kitchen ?Physical Exam ?Vitals and nursing note reviewed.  ?Constitutional:   ?   General: She is not in acute distress. ?   Appearance: Normal appearance. She is normal weight. She is not ill-appearing or toxic-appearing.  ?Eyes:  ?   Extraocular Movements: Extraocular movements intact.  ?   Conjunctiva/sclera: Conjunctivae normal.  ?   Pupils: Pupils are equal, round, and reactive to light.  ?Neck:  ?   Comments: No thyromegaly ?Cardiovascular:  ?   Rate and Rhythm: Normal rate and regular rhythm.  ?   Heart sounds: No murmur heard. ?Musculoskeletal:  ?   Cervical back: Neck supple. No tenderness.  ?   Right lower leg: No edema.  ?   Left lower leg: No edema.  ?Lymphadenopathy:  ?   Cervical: No cervical adenopathy.  ?Neurological:  ?   Mental Status: She is alert and oriented to person, place, and time. Mental status  is at baseline.  ?Psychiatric:     ?   Mood and Affect: Mood normal.     ?  Behavior: Behavior normal.     ?   Thought Content: Thought content normal.     ?   Judgment: Judgment normal.  ? ? ? ?No results found. ?No results found. ?No results found for this or any previous visit (from the past 24 hour(s)). ? ?Assessment/Plan: ?Michelle Ochoa is a 61 y.o. female present for OV for  ?Elevated hemoglobin A1c ?- Hemoglobin P3A ?- Basic Metabolic Panel (BMET) ?Mixed hyperlipidemia ?- TSH ?- Hemoglobin A1c ?- CBC ?- Basic Metabolic Panel (BMET) ? ?Overweight with body mass index (BMI) of 28 to 28.9 in adult/weight loss counseling ?Patient was counseled on exercise, calorie counting, weight loss and potential medications to help with weight loss today. ?-Patient was provided with online resources for: Weekly net calorie calculator.  Applications for calorie counting.  Patient was advised to ensure she is taking in adequate nutrition daily by meeting calorie goals. ?-Patient was educated on dietary changes to not only lose weight but to eat healthy.  Patient was educated on glycemic index. ?-Patient was educated on exercise goal of 150 minutes a week (plus warm up and cool down) of cardiovascular exercise.   ?-Patient was encouraged to maintain adequate water consumption of at least 100 ounces a day, more if exercising/sweating. ?Start [employer] weekly injections. ?Follow-up 4-6 weeks for weight check and consider tapering up on Wegovy at that time. ? ? ? ?Reviewed expectations re: course of current medical issues. ?Discussed self-management of symptoms. ?Outlined signs and symptoms indicating need for more acute intervention. ?Patient verbalized understanding and all questions were answered. ?Patient received an After-Visit Summary. ? ? ? ?Orders Placed This Encounter  ?Procedures  ? TSH  ? Hemoglobin A1c  ? CBC  ? Basic Metabolic Panel (BMET)  ? ?Meds ordered this encounter  ?Medications  ? Semaglutide-Weight Management  0.25 MG/0.5ML SOAJ  ?  Sig: Inject 0.25 mg into the skin once a week.  ?  Dispense:  2 mL  ?  Refill:  1  ? ?Referral Orders  ?No referral(s) requested today  ? ? ? ?Note is dictated utilizing voice recognition

## 2022-02-02 NOTE — Patient Instructions (Addendum)
Start food diary.  ?Track calories stick to 1200-1300 calories a day.  ?100 ounces of water a day.  ? ?Low glycemic index fruit and veggies and a lean meat for meals.  ?Avoid alcohol, flour, rice, pasta, sugar, white potato ? ?At least 150 minutes of exercise a week.  ? ? ? ?

## 2022-02-03 ENCOUNTER — Ambulatory Visit: Payer: BC Managed Care – PPO | Admitting: Family Medicine

## 2022-02-03 ENCOUNTER — Encounter: Payer: Self-pay | Admitting: Family Medicine

## 2022-02-04 ENCOUNTER — Encounter: Payer: Self-pay | Admitting: Family Medicine

## 2022-02-04 DIAGNOSIS — Z6828 Body mass index (BMI) 28.0-28.9, adult: Secondary | ICD-10-CM | POA: Insufficient documentation

## 2022-02-04 DIAGNOSIS — Z6826 Body mass index (BMI) 26.0-26.9, adult: Secondary | ICD-10-CM | POA: Insufficient documentation

## 2022-02-04 LAB — CBC
HCT: 37.9 % (ref 35.0–45.0)
Hemoglobin: 12.7 g/dL (ref 11.7–15.5)
MCH: 29.5 pg (ref 27.0–33.0)
MCHC: 33.5 g/dL (ref 32.0–36.0)
MCV: 88.1 fL (ref 80.0–100.0)
MPV: 11.1 fL (ref 7.5–12.5)
Platelets: 212 10*3/uL (ref 140–400)
RBC: 4.3 10*6/uL (ref 3.80–5.10)
RDW: 13 % (ref 11.0–15.0)
WBC: 5.8 10*3/uL (ref 3.8–10.8)

## 2022-02-04 LAB — BASIC METABOLIC PANEL
BUN: 9 mg/dL (ref 7–25)
CO2: 26 mmol/L (ref 20–32)
Calcium: 8.9 mg/dL (ref 8.6–10.4)
Chloride: 107 mmol/L (ref 98–110)
Creat: 0.94 mg/dL (ref 0.50–1.03)
Glucose, Bld: 100 mg/dL — ABNORMAL HIGH (ref 65–99)
Potassium: 4.1 mmol/L (ref 3.5–5.3)
Sodium: 141 mmol/L (ref 135–146)

## 2022-02-04 LAB — HEMOGLOBIN A1C
Hgb A1c MFr Bld: 5.4 % of total Hgb (ref <5.7)
Mean Plasma Glucose: 108 mg/dL
eAG (mmol/L): 6 mmol/L

## 2022-02-04 LAB — TSH: TSH: 1.73 mIU/L (ref 0.40–4.50)

## 2022-02-04 MED ORDER — SEMAGLUTIDE-WEIGHT MANAGEMENT 0.25 MG/0.5ML ~~LOC~~ SOAJ: 0.2500 mg | SUBCUTANEOUS | 1 refills | Status: DC

## 2022-02-05 ENCOUNTER — Telehealth: Payer: Self-pay

## 2022-02-05 NOTE — Telephone Encounter (Signed)
Michelle Ochoa (Key: [id number]) ?Rx #: [id number] ?Wegovy 0.'25MG'$ /0.5ML auto-injectors ?  ?Form ?[employer] Electronic PA Form [phone] NCPDP) ?

## 2022-02-09 ENCOUNTER — Ambulatory Visit: Payer: BC Managed Care – PPO | Admitting: Family Medicine

## 2022-02-09 ENCOUNTER — Encounter: Payer: Self-pay | Admitting: Family Medicine

## 2022-02-09 VITALS — BP 121/79 | HR 60 | Temp 98.0°F | Ht 66.0 in | Wt 174.0 lb

## 2022-02-09 DIAGNOSIS — B029 Zoster without complications: Secondary | ICD-10-CM

## 2022-02-09 MED ORDER — VALACYCLOVIR HCL 1 G PO TABS: 1000.0000 mg | ORAL_TABLET | Freq: Three times a day (TID) | ORAL | 0 refills | Status: AC

## 2022-02-09 NOTE — Patient Instructions (Signed)

## 2022-02-09 NOTE — Progress Notes (Signed)
? ? ? ?This visit occurred during the SARS-CoV-2 public health emergency.  Safety protocols were in place, including screening questions prior to the visit, additional usage of staff PPE, and extensive cleaning of exam room while observing appropriate contact time as indicated for disinfecting solutions.  ? ? ?Michelle Ochoa , 04/25/1961, 61 y.o., female ?MRN: [id number] ?Patient Care Team  ?  Relationship Specialty Notifications Start End  ?Ma Hillock, DO PCP - General Family Medicine  05/20/16   ?Dian Queen, MD Consulting Physician Obstetrics and Gynecology  05/20/16   ?Doran Stabler, MD Consulting Physician Gastroenterology  02/04/20   ?Lyndal Pulley, DO Consulting Physician Sports Medicine  02/04/20   ?Star Age, MD Attending Physician Neurology  02/04/20   ? ? ?Chief Complaint  ?Patient presents with  ? Numbness  ?  Pt reports shooting pain and numbness on upper and mid back in the location pt had shingles 1.5 years; pt c/o fatigue, HA x 1.5 year worsening in the last 5 days;  ? ?  ?Subjective: Pt presents for an OV with complaints of left shoulder discomfort that started as more of an ache on Friday. Saturday she felt fatigued and now headache. She is under stress. Symptoms are in the same location she had shingles 08/04/2020. She reports she is worried she has internal shingles.  ? ? ?  11/26/2021  ?  9:38 AM 07/22/2020  ?  1:34 PM 02/04/2020  ? 10:09 AM 11/02/2017  ?  3:45 PM 05/25/2016  ? 10:27 AM  ?Depression screen PHQ 2/9  ?Decreased Interest 0 0 0 0 0  ?Down, Depressed, Hopeless 1 0 0 0 0  ?PHQ - 2 Score 1 0 0 0 0  ?Altered sleeping 3      ?Tired, decreased energy 1      ?Change in appetite 1      ?Feeling bad or failure about yourself  1      ?Trouble concentrating 1      ?Moving slowly or fidgety/restless 0      ?Suicidal thoughts 0      ?PHQ-9 Score 8      ? ? ?Allergies  ?Allergen Reactions  ? Codeine Nausea Only  ? Vicodin [Hydrocodone-Acetaminophen] Nausea Only  ? ?Social History   ? ?Social History Narrative  ? Married, [place] 3 adult children [name], [place], [place]).  ? 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 Drinks caffeine.   ? Takes daily vitamin  ? Wears seatbelt  ? exercises routinely.   ? Smoke detector in the home.   ? Feels safe in her relationships.   ? ?Past Medical History:  ?Diagnosis Date  ? Anemia   ? with 1st childbirth  ? Arthritis   ? Basal cell carcinoma   ? Chicken pox   ? Headache   ? History of shingles 07/2020  ? Insomnia   ? PONV (postoperative nausea and vomiting)   ? Tick bite 05/25/2016  ? Toe fracture, right 2012  ? rt great toe  ? UTI (lower urinary tract infection)   ? ?Past Surgical History:  ?Procedure Laterality Date  ? APPENDECTOMY  2005  ? CESAREAN SECTION  [phone]  ? LAPAROSCOPIC ABDOMINAL EXPLORATION    ? ?Family History  ?Problem Relation Age of Onset  ? Breast cancer Mother   ?     30  ? Dementia Mother   ? Prostate cancer Father   ? Colon polyps Father   ? Diabetes Paternal Uncle   ?  Heart disease Paternal Uncle   ? Early death Paternal Uncle   ? Depression Maternal Grandmother   ? Diabetes Maternal Grandfather   ? Diabetes Paternal Grandfather   ? Colon cancer Neg Hx   ? Rectal cancer Neg Hx   ? Stomach cancer Neg Hx   ? ?Allergies as of 02/09/2022   ? ?   Reactions  ? Codeine Nausea Only  ? Vicodin [hydrocodone-acetaminophen] Nausea Only  ? ?  ? ?  ?Medication List  ?  ? ?  ? Accurate as of February 09, 2022  2:03 PM. If you have any questions, ask your nurse or doctor.  ?  ?  ? ?  ? ?buPROPion 150 MG 24 hr tablet ?Commonly known as: WELLBUTRIN XL ?Take 1 tablet (150 mg total) by mouth daily. ?  ?estradiol 0.05 MG/24HR patch ?Commonly known as: VIVELLE-DOT ?1 patch 2 (two) times a week. ?  ?pravastatin 20 MG tablet ?Commonly known as: PRAVACHOL ?Take 20 mg by mouth daily. ?  ?progesterone 100 MG capsule ?Commonly known as: PROMETRIUM ?progesterone micronized 100 mg capsule ? TAKE 1 CAPSULE BY MOUTH EVERY DAY ?  ?Semaglutide-Weight  Management 0.25 MG/0.5ML Soaj ?Inject 0.25 mg into the skin once a week. ?  ?valACYclovir 1000 MG tablet ?Commonly known as: VALTREX ?Take 1 tablet (1,000 mg total) by mouth 3 (three) times daily for 7 days. ?Started by: Howard Pouch, DO ?  ?Vitamin D (Cholecalciferol) 25 MCG (1000 UT) Caps ?Take 1 capsule by mouth daily. ?  ? ?  ? ? ?All past medical history, surgical history, allergies, family history, immunizations andmedications were updated in the EMR today and reviewed under the history and medication portions of their EMR.    ? ?ROS ?Negative, with the exception of above mentioned in HPI ? ? ?Objective:  ?BP 121/79   Pulse 60   Temp 98 ?F (36.7 ?C) (Oral)   Ht '5\' 6"'$  (1.676 m)   Wt 174 lb (78.9 kg)   SpO2 100%   BMI 28.08 kg/m?  ?Body mass index is 28.08 kg/m?Marland Kitchen ?Physical Exam ?Vitals and nursing note reviewed.  ?Constitutional:   ?   General: She is not in acute distress. ?   Appearance: Normal appearance. She is normal weight. She is not ill-appearing or toxic-appearing.  ?Eyes:  ?   Extraocular Movements: Extraocular movements intact.  ?   Conjunctiva/sclera: Conjunctivae normal.  ?   Pupils: Pupils are equal, round, and reactive to light.  ?Musculoskeletal:     ?   General: Tenderness present.  ?Skin: ?   Findings: No rash.  ?Neurological:  ?   Mental Status: She is alert and oriented to person, place, and time. Mental status is at baseline.  ?Psychiatric:     ?   Mood and Affect: Mood normal.     ?   Behavior: Behavior normal.     ?   Thought Content: Thought content normal.     ?   Judgment: Judgment normal.  ? ? ? ?No results found. ?No results found. ?No results found for this or any previous visit (from the past 24 hour(s)). ? ?Assessment/Plan: ?Michelle Ochoa is a 61 y.o. female present for OV for  ?Herpes zoster without complication ?Possible reactivation of shingles, currently in prodrome phase. She does admit to high levels of stress currently.  ?Valtrex TID x7 days.  ?Discussed chronic post  hepatic  neuralgia and tx, she is not interested at this time.  ?F/u PRN ? ?Reviewed expectations  re: course of current medical issues. ?Discussed self-management of symptoms. ?Outlined signs and symptoms indicating need for more acute intervention. ?Patient verbalized understanding and all questions were answered. ?Patient received an After-Visit Summary. ? ? ? ?No orders of the defined types were placed in this encounter. ? ?Meds ordered this encounter  ?Medications  ? valACYclovir (VALTREX) 1000 MG tablet  ?  Sig: Take 1 tablet (1,000 mg total) by mouth 3 (three) times daily for 7 days.  ?  Dispense:  21 tablet  ?  Refill:  0  ? ?Referral Orders  ?No referral(s) requested today  ? ? ? ?Note is dictated utilizing voice recognition software. Although note has been proof read prior to signing, occasional typographical errors still can be missed. If any questions arise, please do not hesitate to call for verification.  ? ?electronically signed by: ? ?Howard Pouch, DO  ?[facility] [place] ? ? ? ?

## 2022-03-04 DIAGNOSIS — H40013 Open angle with borderline findings, low risk, bilateral: Secondary | ICD-10-CM | POA: Diagnosis not present

## 2022-04-19 NOTE — Telephone Encounter (Signed)
Spoke with pt and she wanted a new rx for the 0.'5mg'$  dose of Wegovy. Advised provider was out of office but based on last visit on 4/18, it was recommended she follow up in 4-6 weeks before consideration of tapering up. She mentioned she has lost 12 pounds and changed her diet. Pt was advised a follow up appt would be needed prior to new rx. Pt scheduled for 7/5

## 2022-04-19 NOTE — Telephone Encounter (Signed)
Patient called to change pharmacy locations for [facility] prescription.  Patient has been trying since April to get medication, PRIOR AUTH, med not in stock.  Please call prescription to [facility], they have 1 box left.

## 2022-04-21 ENCOUNTER — Other Ambulatory Visit (HOSPITAL_BASED_OUTPATIENT_CLINIC_OR_DEPARTMENT_OTHER): Payer: Self-pay

## 2022-04-21 ENCOUNTER — Ambulatory Visit: Payer: BC Managed Care – PPO | Admitting: Family Medicine

## 2022-04-21 ENCOUNTER — Telehealth: Payer: Self-pay

## 2022-04-21 ENCOUNTER — Other Ambulatory Visit: Payer: Self-pay

## 2022-04-21 MED ORDER — SEMAGLUTIDE-WEIGHT MANAGEMENT 0.25 MG/0.5ML ~~LOC~~ SOAJ
0.2500 mg | SUBCUTANEOUS | 0 refills | Status: DC
  Filled 2022-04-21: qty 2, 28d supply, fill #0

## 2022-04-21 NOTE — Telephone Encounter (Signed)
Called pt to resch appt. Pt was given a 28 d/s of wegovy sent to [facility]. Pt is schedule for f/u but wants to know if she can go up to 0.'5mg'$ .

## 2022-04-22 ENCOUNTER — Other Ambulatory Visit (HOSPITAL_BASED_OUTPATIENT_CLINIC_OR_DEPARTMENT_OTHER): Payer: Self-pay

## 2022-04-22 ENCOUNTER — Ambulatory Visit: Payer: BC Managed Care – PPO | Admitting: Family Medicine

## 2022-04-23 ENCOUNTER — Other Ambulatory Visit (HOSPITAL_BASED_OUTPATIENT_CLINIC_OR_DEPARTMENT_OTHER): Payer: Self-pay

## 2022-04-23 MED ORDER — SEMAGLUTIDE-WEIGHT MANAGEMENT 0.5 MG/0.5ML ~~LOC~~ SOAJ
0.5000 mg | SUBCUTANEOUS | 0 refills | Status: DC
  Filled 2022-04-23: qty 2, 28d supply, fill #0

## 2022-04-23 NOTE — Telephone Encounter (Signed)
Patient called regarding [facility] prescription status.  She is unsure why it is not approved to be filled.  She has been taken this medication since April.  Please contact patient [phone]

## 2022-04-23 NOTE — Telephone Encounter (Signed)
Spoke with patient regarding results/recommendations.  

## 2022-04-23 NOTE — Addendum Note (Signed)
Addended by: Octaviano Glow on: 04/23/2022 10:59 AM   Modules accepted: Orders

## 2022-04-26 DIAGNOSIS — Z01419 Encounter for gynecological examination (general) (routine) without abnormal findings: Secondary | ICD-10-CM | POA: Diagnosis not present

## 2022-04-26 DIAGNOSIS — Z6826 Body mass index (BMI) 26.0-26.9, adult: Secondary | ICD-10-CM | POA: Diagnosis not present

## 2022-04-26 DIAGNOSIS — Z1231 Encounter for screening mammogram for malignant neoplasm of breast: Secondary | ICD-10-CM | POA: Diagnosis not present

## 2022-04-26 LAB — HM MAMMOGRAPHY

## 2022-04-29 ENCOUNTER — Ambulatory Visit: Payer: BC Managed Care – PPO | Admitting: Family Medicine

## 2022-04-29 ENCOUNTER — Other Ambulatory Visit (HOSPITAL_BASED_OUTPATIENT_CLINIC_OR_DEPARTMENT_OTHER): Payer: Self-pay

## 2022-04-29 ENCOUNTER — Encounter: Payer: Self-pay | Admitting: Family Medicine

## 2022-04-29 VITALS — BP 97/62 | HR 69 | Temp 98.1°F | Ht 66.0 in | Wt 163.0 lb

## 2022-04-29 DIAGNOSIS — E782 Mixed hyperlipidemia: Secondary | ICD-10-CM

## 2022-04-29 DIAGNOSIS — Z713 Dietary counseling and surveillance: Secondary | ICD-10-CM

## 2022-04-29 MED ORDER — SEMAGLUTIDE-WEIGHT MANAGEMENT 0.5 MG/0.5ML ~~LOC~~ SOAJ
0.5000 mg | SUBCUTANEOUS | 1 refills | Status: DC
  Filled 2022-04-29 – 2022-05-26 (×2): qty 2, 28d supply, fill #0
  Filled 2023-01-11 – 2023-02-16 (×2): qty 2, 28d supply, fill #1

## 2022-04-29 NOTE — Progress Notes (Signed)
Michelle Ochoa , [birth year], 61 y.o., female MRN: [id number] Patient Care Team    Relationship Specialty Notifications Start End  Ma Hillock, DO PCP - General Family Medicine  05/20/16   Dian Queen, MD Consulting Physician Obstetrics and Gynecology  05/20/16   Doran Stabler, MD Consulting Physician Gastroenterology  02/04/20   Lyndal Pulley, [place] Physician Sports Medicine  02/04/20   Star Age, MD Attending Physician Neurology  02/04/20     Chief Complaint  Patient presents with   Weight Check    Pt is not fasting     Subjective: Pt presents for an OV for weight loss counseling Start: 174 pounds -BMI of 28.08. Today patient reports 163 pounds with a BMI of 26.31. Patient reports she is doing well.  She is exercising and changing her diet.  She is tolerating the Wegovy 0.25 mg weekly injections.  Prior note: She reports she has continued to gain weight over the last few years and has had great difficulty taking the weight off.  She does exercise.  She has tried to watch her diet but seems to still put on weight despite her feeling she eats a healthy diet.    She presents to discuss weight loss counseling and is interested in trying a medication to help her with weight loss.  She has tried phentermine in the past. She has a history of elevated hemoglobin A1c, vitamin D deficiency, hyperlipidemia. She is taking Wellbutrin 150 mg daily.  She is prescribed estradiol patch and progesterone capsules by her gynecology team.     11/26/2021    9:38 AM 07/22/2020    1:34 PM 02/04/2020   10:09 AM 11/02/2017    3:45 PM 05/25/2016   10:27 AM  Depression screen PHQ 2/9  Decreased Interest 0 0 0 0 0  Down, Depressed, Hopeless 1 0 0 0 0  PHQ - 2 Score 1 0 0 0 0  Altered sleeping 3      Tired, decreased energy 1      Change in appetite 1      Feeling bad or failure about yourself  1      Trouble concentrating 1      Moving slowly or fidgety/restless 0      Suicidal  thoughts 0      PHQ-9 Score 8        Allergies  Allergen Reactions   Codeine Nausea Only   Vicodin [Hydrocodone-Acetaminophen] Nausea Only   Social History   Social History Narrative   Married, Michelle Ochoa). 3 adult children Michelle Ochoa, [employer], [place]).   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    Takes daily vitamin   Wears seatbelt   exercises routinely.    Smoke detector in the home.    Feels safe in her relationships.    Past Medical History:  Diagnosis Date   Anemia    with 1st childbirth   Arthritis    Basal cell carcinoma    Chicken pox    Headache    History of shingles 07/2020   Insomnia    PONV (postoperative nausea and vomiting)    Tick bite 05/25/2016   Toe fracture, right 2012   rt great toe   UTI (lower urinary tract infection)    Past Surgical History:  Procedure Laterality Date   APPENDECTOMY  2005   CESAREAN SECTION  [phone]   LAPAROSCOPIC ABDOMINAL EXPLORATION     Family History  Problem Relation Age of Onset   Breast cancer Mother        24   Dementia Mother    Prostate cancer Father    Colon polyps Father    Diabetes Paternal Uncle    Heart disease Paternal Uncle    Early death Paternal Uncle    Depression Maternal Grandmother    Diabetes Maternal Grandfather    Diabetes Paternal Grandfather    Colon cancer Neg Hx    Rectal cancer Neg Hx    Stomach cancer Neg Hx    Allergies as of 04/29/2022       Reactions   Codeine Nausea Only   Vicodin [hydrocodone-acetaminophen] Nausea Only        Medication List        Accurate as of April 29, 2022 11:59 PM. If you have any questions, ask your nurse or doctor.          buPROPion 150 MG 24 hr tablet Commonly known as: WELLBUTRIN XL Take 1 tablet (150 mg total) by mouth daily.   estradiol 0.05 MG/24HR patch Commonly known as: VIVELLE-DOT 1 patch 2 (two) times a week.   pravastatin 20 MG tablet Commonly known as: PRAVACHOL Take 20 mg by mouth  daily.   progesterone 100 MG capsule Commonly known as: PROMETRIUM progesterone micronized 100 mg capsule  TAKE 1 CAPSULE BY MOUTH EVERY DAY   Semaglutide-Weight Management 0.5 MG/0.5ML Soaj Inject 0.5 mg into the skin once a week for 28 days.   Vitamin D (Cholecalciferol) 25 MCG (1000 UT) Caps Take 1 capsule by mouth daily.        All past medical history, surgical history, allergies, family history, immunizations andmedications were updated in the EMR today and reviewed under the history and medication portions of their EMR.     ROS Negative, with the exception of above mentioned in HPI  Objective:  BP 97/62   Pulse 69   Temp 98.1 F (36.7 C) (Oral)   Ht '5\' 6"'$  (1.676 m)   Wt 163 lb (73.9 kg)   SpO2 97%   BMI 26.31 kg/m  Body mass index is 26.31 kg/m. Physical Exam Vitals and nursing note reviewed.  Constitutional:      General: She is not in acute distress.    Appearance: Normal appearance. She is normal weight. She is not ill-appearing or toxic-appearing.  Eyes:     Extraocular Movements: Extraocular movements intact.     Conjunctiva/sclera: Conjunctivae normal.     Pupils: Pupils are equal, round, and reactive to light.  Musculoskeletal:     Right lower leg: No edema.     Left lower leg: No edema.  Neurological:     Mental Status: She is alert and oriented to person, place, and time. Mental status is at baseline.  Psychiatric:        Mood and Affect: Mood normal.        Behavior: Behavior normal.        Thought Content: Thought content normal.        Judgment: Judgment normal.     No results found. No results found. No results found for this or any previous visit (from the past 24 hour(s)).  Assessment/Plan: Michelle Ochoa is a 61 y.o. female present for OV for  Elevated hemoglobin A1c Mixed hyperlipidemia  Overweight with body mass index (BMI) of 26 in adult/weight loss counseling SHe is doing well and has lost weight.  Today's weight is 163.   This weight  loss is been on the low-dose Wegovy.  Suspect she will have continued weight loss as we taper up to [facility] 0.5 mg weekly injection. Patient has been counseled on exercise, calorie counting, weight loss and potential medications to help with weight loss  -Patient was provided with online resources for: Weekly net calorie calculator.  Applications for calorie counting.  Patient was advised to ensure she is taking in adequate nutrition daily by meeting calorie goals. -Patient was educated on dietary changes to not only lose weight but to eat healthy.  Patient was educated on glycemic index. -Patient was educated on exercise goal of 150 minutes a week (plus warm up and cool down) of cardiovascular exercise.   -Patient was encouraged to maintain adequate water consumption of at least 100 ounces a day, more if exercising/sweating. Follow-up especially weeks weeks for weight check and consider tapering up on Wegovy at that time.    Reviewed expectations re: course of current medical issues. Discussed self-management of symptoms. Outlined signs and symptoms indicating need for more acute intervention. Patient verbalized understanding and all questions were answered. Patient received an After-Visit Summary.    No orders of the defined types were placed in this encounter.  Meds ordered this encounter  Medications   Semaglutide-Weight Management 0.5 MG/0.5ML SOAJ    Sig: Inject 0.5 mg into the skin once a week for 28 days.    Dispense:  2 mL    Refill:  1   Referral Orders  No referral(s) requested today     Note is dictated utilizing voice recognition software. Although note has been proof read prior to signing, occasional typographical errors still can be missed. If any questions arise, please do not hesitate to call for verification.   electronically signed by:  Howard Pouch, DO  [place]

## 2022-04-29 NOTE — Patient Instructions (Signed)
Great job!!!!

## 2022-05-26 ENCOUNTER — Other Ambulatory Visit (HOSPITAL_BASED_OUTPATIENT_CLINIC_OR_DEPARTMENT_OTHER): Payer: Self-pay

## 2022-06-01 ENCOUNTER — Other Ambulatory Visit (HOSPITAL_BASED_OUTPATIENT_CLINIC_OR_DEPARTMENT_OTHER): Payer: Self-pay

## 2022-06-01 ENCOUNTER — Encounter (HOSPITAL_BASED_OUTPATIENT_CLINIC_OR_DEPARTMENT_OTHER): Payer: Self-pay

## 2022-06-02 ENCOUNTER — Other Ambulatory Visit: Payer: Self-pay

## 2022-06-04 ENCOUNTER — Telehealth: Payer: Self-pay | Admitting: Family Medicine

## 2022-06-04 ENCOUNTER — Other Ambulatory Visit: Payer: Self-pay | Admitting: Family Medicine

## 2022-06-04 MED ORDER — PRAVASTATIN SODIUM 20 MG PO TABS: 20.0000 mg | ORAL_TABLET | Freq: Every day | ORAL | 3 refills | Status: DC

## 2022-06-04 NOTE — Progress Notes (Signed)
I have refilled it for her.

## 2022-06-04 NOTE — Telephone Encounter (Signed)
Rx was d/c by you please advise if ok to refill?

## 2022-06-04 NOTE — Telephone Encounter (Signed)
I have refilled it for her.

## 2022-06-04 NOTE — Telephone Encounter (Signed)
Pt informed

## 2022-06-04 NOTE — Telephone Encounter (Signed)
Pt reports Pharmacist informed her to reach out to [place] about Pravastatin. She mentions that the pharmacy has sent [place] two faxes over the past few days and no action has been done. If Dr. Raoul Pitch does not want her to take the medication any longer please inform patient.

## 2022-06-28 ENCOUNTER — Telehealth: Payer: Self-pay

## 2022-06-28 NOTE — Telephone Encounter (Signed)
Lvm to inform pt we will refill meds during appt

## 2022-06-28 NOTE — Telephone Encounter (Signed)
Patient requesting increased dose on Wegovy.  Patient is scheduled for OV tomorrow 9/12 with Dr. Raoul Pitch.  [facility]

## 2022-06-28 NOTE — Telephone Encounter (Signed)
We can discuss the increase tomorrow during her appointment

## 2022-06-28 NOTE — Telephone Encounter (Signed)
Please advise 

## 2022-06-29 ENCOUNTER — Ambulatory Visit: Payer: BC Managed Care – PPO | Admitting: Family Medicine

## 2022-06-29 ENCOUNTER — Other Ambulatory Visit: Payer: Self-pay

## 2022-06-29 ENCOUNTER — Other Ambulatory Visit (HOSPITAL_BASED_OUTPATIENT_CLINIC_OR_DEPARTMENT_OTHER): Payer: Self-pay

## 2022-06-29 ENCOUNTER — Encounter: Payer: Self-pay | Admitting: Family Medicine

## 2022-06-29 VITALS — BP 103/68 | HR 62 | Temp 97.3°F | Ht 66.0 in | Wt 162.0 lb

## 2022-06-29 DIAGNOSIS — E782 Mixed hyperlipidemia: Secondary | ICD-10-CM

## 2022-06-29 DIAGNOSIS — Z713 Dietary counseling and surveillance: Secondary | ICD-10-CM | POA: Diagnosis not present

## 2022-06-29 DIAGNOSIS — Z6826 Body mass index (BMI) 26.0-26.9, adult: Secondary | ICD-10-CM

## 2022-06-29 DIAGNOSIS — R7309 Other abnormal glucose: Secondary | ICD-10-CM | POA: Diagnosis not present

## 2022-06-29 MED ORDER — WEGOVY 1 MG/0.5ML ~~LOC~~ SOAJ
1.0000 mg | SUBCUTANEOUS | 3 refills | Status: DC
  Filled 2022-06-29: qty 2, 28d supply, fill #0
  Filled 2022-07-24: qty 2, 28d supply, fill #1
  Filled 2022-09-07 – 2022-09-17 (×2): qty 2, 28d supply, fill #2

## 2022-06-29 MED ORDER — BUPROPION HCL ER (XL) 150 MG PO TB24: 150.0000 mg | ORAL_TABLET | Freq: Every day | ORAL | 0 refills | Status: DC

## 2022-06-29 NOTE — Progress Notes (Signed)
Michelle Ochoa , 1960/12/25, 61 y.o., female MRN: [id number] Patient Care Team    Relationship Specialty Notifications Start End  Ma Hillock, DO PCP - General Family Medicine  05/20/16   Dian Queen, MD Consulting Physician Obstetrics and Gynecology  05/20/16   Doran Stabler, MD Consulting Physician Gastroenterology  02/04/20   Lyndal Pulley, [place] Physician Sports Medicine  02/04/20   Star Age, MD Attending Physician Neurology  02/04/20     Chief Complaint  Patient presents with   Weight Check     Subjective: Pt presents for an OV for weight loss counseling Start: 174 pounds -BMI of 28.08. 2nd) 163 pounds with a BMI of 26.31. Today-162 lbs/BMI 26.15 Patient reports she is still doing well with [facility] use. She was under 160 lba before she went on vacation, she just returned.  She is exercising and changing her diet.  She is tolerating the Wegovy 0.5 mg weekly injections.  Prior note: She reports she has continued to gain weight over the last few years and has had great difficulty taking the weight off.  She does exercise.  She has tried to watch her diet but seems to still put on weight despite her feeling she eats a healthy diet.    She presents to discuss weight loss counseling and is interested in trying a medication to help her with weight loss.  She has tried phentermine in the past. She has a history of elevated hemoglobin A1c, vitamin D deficiency, hyperlipidemia. She is taking Wellbutrin 150 mg daily.  She is prescribed estradiol patch and progesterone capsules by her gynecology team.     11/26/2021    9:38 AM 07/22/2020    1:34 PM 02/04/2020   10:09 AM 11/02/2017    3:45 PM 05/25/2016   10:27 AM  Depression screen PHQ 2/9  Decreased Interest 0 0 0 0 0  Down, Depressed, Hopeless 1 0 0 0 0  PHQ - 2 Score 1 0 0 0 0  Altered sleeping 3      Tired, decreased energy 1      Change in appetite 1      Feeling bad or failure about yourself  1       Trouble concentrating 1      Moving slowly or fidgety/restless 0      Suicidal thoughts 0      PHQ-9 Score 8        Allergies  Allergen Reactions   Codeine Nausea Only   Vicodin [Hydrocodone-Acetaminophen] Nausea Only   Social History   Social History Narrative   Married, [employer]). 3 adult children [name], [place], [place]).   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    Takes daily vitamin   Wears seatbelt   exercises routinely.    Smoke detector in the home.    Feels safe in her relationships.    Past Medical History:  Diagnosis Date   Anemia    with 1st childbirth   Arthritis    Basal cell carcinoma    Chicken pox    Headache    History of shingles 07/2020   Insomnia    PONV (postoperative nausea and vomiting)    Tick bite 05/25/2016   Toe fracture, right 2012   rt great toe   UTI (lower urinary tract infection)    Past Surgical History:  Procedure Laterality Date   APPENDECTOMY  2005   CESAREAN SECTION  [phone]  LAPAROSCOPIC ABDOMINAL EXPLORATION     Family History  Problem Relation Age of Onset   Breast cancer Mother        75   Dementia Mother    Prostate cancer Father    Colon polyps Father    Diabetes Paternal Uncle    Heart disease Paternal Uncle    Early death Paternal Uncle    Depression Maternal Grandmother    Diabetes Maternal Grandfather    Diabetes Paternal Grandfather    Colon cancer Neg Hx    Rectal cancer Neg Hx    Stomach cancer Neg Hx    Allergies as of 06/29/2022       Reactions   Codeine Nausea Only   Vicodin [hydrocodone-acetaminophen] Nausea Only        Medication List        Accurate as of June 29, 2022  8:27 AM. If you have any questions, ask your nurse or doctor.          buPROPion 150 MG 24 hr tablet Commonly known as: WELLBUTRIN XL Take 1 tablet (150 mg total) by mouth daily.   estradiol 0.05 MG/24HR patch Commonly known as: VIVELLE-DOT 1 patch 2 (two) times a  week.   pravastatin 20 MG tablet Commonly known as: PRAVACHOL Take 1 tablet (20 mg total) by mouth daily.   progesterone 100 MG capsule Commonly known as: PROMETRIUM progesterone micronized 100 mg capsule  TAKE 1 CAPSULE BY MOUTH EVERY DAY   Vitamin D (Cholecalciferol) 25 MCG (1000 UT) Caps Take 1 capsule by mouth daily.   Wegovy 0.5 MG/0.5ML Soaj Generic drug: Semaglutide-Weight Management Inject 0.5 mg into the skin once a week for 28 days.        All past medical history, surgical history, allergies, family history, immunizations andmedications were updated in the EMR today and reviewed under the history and medication portions of their EMR.     ROS Negative, with the exception of above mentioned in HPI  Objective:  BP 103/68   Pulse 62   Temp (!) 97.3 F (36.3 C) (Oral)   Ht '5\' 6"'$  (1.676 m)   Wt 162 lb (73.5 kg)   SpO2 99%   BMI 26.15 kg/m  Body mass index is 26.15 kg/m. Physical Exam Vitals and nursing note reviewed.  Constitutional:      General: She is not in acute distress.    Appearance: Normal appearance. She is normal weight. She is not ill-appearing or toxic-appearing.  Eyes:     Extraocular Movements: Extraocular movements intact.     Conjunctiva/sclera: Conjunctivae normal.     Pupils: Pupils are equal, round, and reactive to light.  Musculoskeletal:     Right lower leg: No edema.     Left lower leg: No edema.  Neurological:     Mental Status: She is alert and oriented to person, place, and time. Mental status is at baseline.  Psychiatric:        Mood and Affect: Mood normal.        Behavior: Behavior normal.        Thought Content: Thought content normal.        Judgment: Judgment normal.     No results found. No results found. No results found for this or any previous visit (from the past 24 hour(s)).  Assessment/Plan: Michelle Ochoa is a 61 y.o. female present for OV for  Elevated hemoglobin A1c Mixed hyperlipidemia Overweight with  body mass index (BMI) of 26 in adult/weight loss  counseling She is doing well and has lost weight.  Today's weight is 162.   Wegovy 0.5 mg weekly injection were difficult to find in stock. She also just got back from vacation. > Increase to wegovy 1 mg weekly. Patient has been counseled on exercise, calorie counting, weight loss and potential medications to help with weight loss  -Patient was provided with online resources for: Weekly net calorie calculator.  Applications for calorie counting.  Patient was advised to ensure she is taking in adequate nutrition daily by meeting calorie goals. -Patient was educated on dietary changes to not only lose weight but to eat healthy.  Patient was educated on glycemic index. -Patient was educated on exercise goal of 150 minutes a week (plus warm up and cool down) of cardiovascular exercise.   -Patient was encouraged to maintain adequate water consumption of at least 100 ounces a day, more if exercising/sweating. Follow-up 10-12 weight check and consider tapering up on Wegovy at that time.    Reviewed expectations re: course of current medical issues. Discussed self-management of symptoms. Outlined signs and symptoms indicating need for more acute intervention. Patient verbalized understanding and all questions were answered. Patient received an After-Visit Summary.    No orders of the defined types were placed in this encounter.  No orders of the defined types were placed in this encounter.  Referral Orders  No referral(s) requested today     Note is dictated utilizing voice recognition software. Although note has been proof read prior to signing, occasional typographical errors still can be missed. If any questions arise, please do not hesitate to call for verification.   electronically signed by:  Howard Pouch, DO  [place]

## 2022-06-29 NOTE — Patient Instructions (Addendum)
Return in about 3 months (around 09/20/2022).        Great to see you today.  I have refilled the medication(s) we provide.   If labs were collected, we will inform you of lab results once received either by echart message or telephone call.   - echart message- for normal results that have been seen by the patient already.   - telephone call: abnormal results or if patient has not viewed results in their echart.

## 2022-07-20 ENCOUNTER — Other Ambulatory Visit (HOSPITAL_BASED_OUTPATIENT_CLINIC_OR_DEPARTMENT_OTHER): Payer: Self-pay

## 2022-07-26 ENCOUNTER — Other Ambulatory Visit (HOSPITAL_BASED_OUTPATIENT_CLINIC_OR_DEPARTMENT_OTHER): Payer: Self-pay

## 2022-09-07 ENCOUNTER — Other Ambulatory Visit (HOSPITAL_BASED_OUTPATIENT_CLINIC_OR_DEPARTMENT_OTHER): Payer: Self-pay

## 2022-09-07 ENCOUNTER — Telehealth: Payer: Self-pay | Admitting: Family Medicine

## 2022-09-07 NOTE — Telephone Encounter (Signed)
Perfectly ok to remain on '1mg'$  dose of [employer]

## 2022-09-07 NOTE — Telephone Encounter (Signed)
Pt called pharmacy and was told that she has been staying at the same 1 MG for too long, however she would like to stay at that 1 mg being as it has been working for her and she has had little to no issues with it. Please advise patient if this can be approved.

## 2022-09-08 ENCOUNTER — Other Ambulatory Visit (HOSPITAL_BASED_OUTPATIENT_CLINIC_OR_DEPARTMENT_OTHER): Payer: Self-pay

## 2022-09-13 ENCOUNTER — Other Ambulatory Visit (HOSPITAL_BASED_OUTPATIENT_CLINIC_OR_DEPARTMENT_OTHER): Payer: Self-pay

## 2022-09-14 ENCOUNTER — Other Ambulatory Visit (HOSPITAL_BASED_OUTPATIENT_CLINIC_OR_DEPARTMENT_OTHER): Payer: Self-pay

## 2022-09-15 ENCOUNTER — Other Ambulatory Visit (HOSPITAL_BASED_OUTPATIENT_CLINIC_OR_DEPARTMENT_OTHER): Payer: Self-pay

## 2022-09-16 ENCOUNTER — Other Ambulatory Visit (HOSPITAL_BASED_OUTPATIENT_CLINIC_OR_DEPARTMENT_OTHER): Payer: Self-pay

## 2022-09-17 ENCOUNTER — Other Ambulatory Visit (HOSPITAL_BASED_OUTPATIENT_CLINIC_OR_DEPARTMENT_OTHER): Payer: Self-pay

## 2022-09-20 ENCOUNTER — Other Ambulatory Visit (HOSPITAL_BASED_OUTPATIENT_CLINIC_OR_DEPARTMENT_OTHER): Payer: Self-pay

## 2022-09-21 ENCOUNTER — Other Ambulatory Visit (HOSPITAL_BASED_OUTPATIENT_CLINIC_OR_DEPARTMENT_OTHER): Payer: Self-pay

## 2022-09-21 ENCOUNTER — Telehealth: Payer: Self-pay | Admitting: Family Medicine

## 2022-09-21 NOTE — Telephone Encounter (Signed)
Pt calls and states that she is ok with going up to 1.7 mg with Wegovy if this will get insurance to approve it.

## 2022-09-21 NOTE — Telephone Encounter (Signed)
Please advise 

## 2022-09-22 ENCOUNTER — Other Ambulatory Visit (HOSPITAL_BASED_OUTPATIENT_CLINIC_OR_DEPARTMENT_OTHER): Payer: Self-pay

## 2022-09-22 ENCOUNTER — Other Ambulatory Visit: Payer: Self-pay

## 2022-09-22 ENCOUNTER — Telehealth: Payer: Self-pay | Admitting: Family Medicine

## 2022-09-22 MED ORDER — WEGOVY 1 MG/0.5ML ~~LOC~~ SOAJ: 1.0000 mg | SUBCUTANEOUS | 3 refills | Status: DC

## 2022-09-22 MED ORDER — WEGOVY 1 MG/0.5ML ~~LOC~~ SOAJ
1.0000 mg | SUBCUTANEOUS | 3 refills | Status: DC
  Filled 2022-09-22 – 2022-09-28 (×2): qty 2, 28d supply, fill #0

## 2022-09-22 NOTE — Telephone Encounter (Signed)
Please see other encounter.

## 2022-09-22 NOTE — Telephone Encounter (Signed)
Informed pt that we sent the 1 mg to the pharmacy and retry the PA.  Rx resent to [facility]

## 2022-09-22 NOTE — Telephone Encounter (Signed)
Michelle Ochoa E33 minutes ago (1:16 PM)   IM Carli called to inform us that [facility] is currently out of stock of the 1 mg wegovy. However, the [facility] in [place] is where she would like that called into. The phone number for the pharmacy is [phone]

## 2022-09-22 NOTE — Telephone Encounter (Signed)
I have called in the [facility] 1 mg injection again, I do not recommend going up to 1.7 at this time, and patient had agreed. Please see if we can get the 1 mg injections covered by insurance due to increased side effects with increased dose.

## 2022-09-22 NOTE — Telephone Encounter (Signed)
Michelle Ochoa called to inform us that [facility] is currently out of stock of the 1 mg wegovy. However, the [facility] in [place] is where she would like that called into. The phone number for the pharmacy is [phone]

## 2022-09-23 ENCOUNTER — Other Ambulatory Visit (HOSPITAL_BASED_OUTPATIENT_CLINIC_OR_DEPARTMENT_OTHER): Payer: Self-pay

## 2022-09-24 ENCOUNTER — Other Ambulatory Visit (HOSPITAL_BASED_OUTPATIENT_CLINIC_OR_DEPARTMENT_OTHER): Payer: Self-pay

## 2022-09-24 ENCOUNTER — Other Ambulatory Visit: Payer: Self-pay

## 2022-09-24 ENCOUNTER — Other Ambulatory Visit: Payer: Self-pay | Admitting: Family Medicine

## 2022-09-24 MED ORDER — BUPROPION HCL ER (XL) 150 MG PO TB24: 150.0000 mg | ORAL_TABLET | Freq: Every day | ORAL | 0 refills | Status: DC

## 2022-09-28 ENCOUNTER — Other Ambulatory Visit (HOSPITAL_BASED_OUTPATIENT_CLINIC_OR_DEPARTMENT_OTHER): Payer: Self-pay

## 2022-09-29 ENCOUNTER — Telehealth: Payer: Self-pay

## 2022-09-29 NOTE — Telephone Encounter (Signed)
We do not have wegovy samples to provide and if her insurance is making her start from beginning, I am assuming it would need to be prescribed for proof. This is a very odd request from the insurance to begin with, to refuse to maintain a dose and make her either raise the dose or start over. ??? Please ask pt if she would rather increase dose, or we can try to get the 0.5 mg covered.

## 2022-09-29 NOTE — Telephone Encounter (Signed)
Per ins pt will need to restart therapy to start the 1 mg Wegovy. Pt has been made aware.   Please advise on dose change. And if we can give sample to assist.

## 2022-09-30 ENCOUNTER — Other Ambulatory Visit (HOSPITAL_BASED_OUTPATIENT_CLINIC_OR_DEPARTMENT_OTHER): Payer: Self-pay

## 2022-09-30 MED ORDER — WEGOVY 0.25 MG/0.5ML ~~LOC~~ SOAJ
0.2500 mg | SUBCUTANEOUS | 0 refills | Status: DC
  Filled 2022-09-30: qty 2, 28d supply, fill #0

## 2022-09-30 MED ORDER — WEGOVY 0.5 MG/0.5ML ~~LOC~~ SOAJ
0.5000 mg | SUBCUTANEOUS | 0 refills | Status: DC
  Filled 2022-09-30: qty 2, 28d supply, fill #0

## 2022-09-30 NOTE — Telephone Encounter (Signed)
I have called in the Wegovy 0.25 dose for a month, then Wegovy 0.5 for 4 weeks. If she is continuing medications, she will need to follow-up in 7 weeks before we would increase the dose again/she would need new prescriptions.

## 2022-09-30 NOTE — Addendum Note (Signed)
Addended by: Howard Pouch A on: 09/30/2022 01:59 PM   Modules accepted: Orders

## 2022-09-30 NOTE — Telephone Encounter (Signed)
Due to ins since pt has missed more than 2 doses pt will have to restart therapy.

## 2022-10-01 NOTE — Telephone Encounter (Signed)
Spoke with pt regarding medication and instructions.  

## 2022-10-06 ENCOUNTER — Other Ambulatory Visit (HOSPITAL_BASED_OUTPATIENT_CLINIC_OR_DEPARTMENT_OTHER): Payer: Self-pay

## 2022-10-07 ENCOUNTER — Other Ambulatory Visit: Payer: Self-pay | Admitting: Family Medicine

## 2022-10-07 ENCOUNTER — Other Ambulatory Visit (HOSPITAL_BASED_OUTPATIENT_CLINIC_OR_DEPARTMENT_OTHER): Payer: Self-pay

## 2022-10-07 MED ORDER — WEGOVY 1 MG/0.5ML ~~LOC~~ SOAJ
1.0000 mg | SUBCUTANEOUS | 3 refills | Status: DC
  Filled 2022-10-07 – 2022-10-08 (×2): qty 2, 28d supply, fill #0

## 2022-10-07 NOTE — Telephone Encounter (Signed)
Michelle Ochoa (Key: [id number]) Rx #: [id number] HWYSHU '1MG'$ /0.5ML auto-injectors Form [employer] Electronic PA Form [phone] NCPDP) Determination Favorable 9 days ago Your prior authorization for [name] has been approved!   Message from plan: CaseId:[id number];Status:Approved;Review Type:Prior Auth;Coverage Start Date:08/24/2022;Coverage End Date:04/26/2023;;CaseId:[id number];Status:Denied;Review Type:Prior Auth;Appeal Information:;

## 2022-10-08 ENCOUNTER — Other Ambulatory Visit (HOSPITAL_BASED_OUTPATIENT_CLINIC_OR_DEPARTMENT_OTHER): Payer: Self-pay

## 2022-10-08 MED ORDER — WEGOVY 1 MG/0.5ML ~~LOC~~ SOAJ
1.0000 mg | SUBCUTANEOUS | 3 refills | Status: DC
  Filled 2022-10-08: qty 2, 28d supply, fill #0

## 2022-10-08 NOTE — Telephone Encounter (Signed)
Pt called in to check the status of her prior auth. I informed the patient from my end it appears the '1MG'$  has been approved.

## 2022-10-08 NOTE — Addendum Note (Signed)
Addended by: Kavin Leech on: 10/08/2022 12:05 PM   Modules accepted: Orders

## 2022-10-12 NOTE — Telephone Encounter (Signed)
Noted  

## 2022-10-14 ENCOUNTER — Other Ambulatory Visit (HOSPITAL_BASED_OUTPATIENT_CLINIC_OR_DEPARTMENT_OTHER): Payer: Self-pay

## 2022-10-19 ENCOUNTER — Other Ambulatory Visit (HOSPITAL_BASED_OUTPATIENT_CLINIC_OR_DEPARTMENT_OTHER): Payer: Self-pay

## 2022-10-20 ENCOUNTER — Telehealth: Payer: Self-pay | Admitting: Family Medicine

## 2022-10-20 NOTE — Telephone Encounter (Signed)
We have now called in multiple doses of Wegovy for her. If there is any other changes made we will need to see her back in the office to be sure everybody is on the same page with dosage and approvals.  The multiple phone messages are making this confusing. Currently the information I have is that the [facility] 1 mg is approved and that had been called in for her.  I recommend she start the medication back up.  If she has increased symptoms, then she will need to follow-up for proper documentation and prescription.  Thanks

## 2022-10-20 NOTE — Telephone Encounter (Signed)
This has been an ongoing issue regarding the 1 mg wegovy being approved by insurance, however Michelle Ochoa has stated that she is open to take any mg as long as the insurance will cover it. She also mentions that she may need to go down in mg due to being off the medication for about 6 weeks now.

## 2022-10-20 NOTE — Telephone Encounter (Signed)
Spoke with pt and sent notification from [place] on approval of 1 mg. Please advise if any other changes need to be made.

## 2022-10-21 ENCOUNTER — Other Ambulatory Visit (HOSPITAL_BASED_OUTPATIENT_CLINIC_OR_DEPARTMENT_OTHER): Payer: Self-pay

## 2022-10-22 ENCOUNTER — Other Ambulatory Visit (HOSPITAL_BASED_OUTPATIENT_CLINIC_OR_DEPARTMENT_OTHER): Payer: Self-pay

## 2022-10-22 NOTE — Telephone Encounter (Signed)
Spoke with pt and informed her that we will contact the pharmacy to figure the concern from their side.

## 2022-10-22 NOTE — Telephone Encounter (Signed)
Called pharmacy who stated that although the medication is approve for the 1 mg of Wegovy, pt will not be able to pickup med until Oct. (Her last dose). Per pharmacy rn the pt can pickup the 1.7 mg as her next dose.   Pharmacist was kind enough to push the medication through to confirm and prevent further confusion.   Pt will be in OV Monday for another issue.

## 2022-10-25 ENCOUNTER — Ambulatory Visit: Payer: BC Managed Care – PPO | Admitting: Family Medicine

## 2022-10-25 NOTE — Progress Notes (Deleted)
Michelle Ochoa , [birth year], 62 y.o., female MRN: [id number] Patient Care Team    Relationship Specialty Notifications Start End  Ma Hillock, DO PCP - General Family Medicine  05/20/16   Dian Queen, MD Consulting Physician Obstetrics and Gynecology  05/20/16   Doran Stabler, MD Consulting Physician Gastroenterology  02/04/20   Lyndal Pulley, [place] Physician Sports Medicine  02/04/20   Star Age, MD Attending Physician Neurology  02/04/20     No chief complaint on file.    Subjective: Pt presents for an OV with complaints of *** of *** duration.  Associated symptoms include ***.  Pt has tried *** to ease their symptoms.      11/26/2021    9:38 AM 07/22/2020    1:34 PM 02/04/2020   10:09 AM 11/02/2017    3:45 PM 05/25/2016   10:27 AM  Depression screen PHQ 2/9  Decreased Interest 0 0 0 0 0  Down, Depressed, Hopeless 1 0 0 0 0  PHQ - 2 Score 1 0 0 0 0  Altered sleeping 3      Tired, decreased energy 1      Change in appetite 1      Feeling bad or failure about yourself  1      Trouble concentrating 1      Moving slowly or fidgety/restless 0      Suicidal thoughts 0      PHQ-9 Score 8        Allergies  Allergen Reactions   Codeine Nausea Only   Vicodin [Hydrocodone-Acetaminophen] Nausea Only   Social History   Social History Narrative   Mar[place], [place] 3 adult children Michelle Ochoa, [place], [place]).   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    Takes daily vitamin   Wears seatbelt   exercises routinely.    Smoke detector in the home.    Feels safe in her relationships.    Past Medical History:  Diagnosis Date   Anemia    with 1st childbirth   Arthritis    Basal cell carcinoma    Chicken pox    Headache    History of shingles 07/2020   Insomnia    PONV (postoperative nausea and vomiting)    Tick bite 05/25/2016   Toe fracture, right 2012   rt great toe   UTI (lower urinary tract infection)    Past  Surgical History:  Procedure Laterality Date   APPENDECTOMY  2005   CESAREAN SECTION  [phone]   LAPAROSCOPIC ABDOMINAL EXPLORATION     Family History  Problem Relation Age of Onset   Breast cancer Mother        47   Dementia Mother    Prostate cancer Father    Colon polyps Father    Diabetes Paternal Uncle    Heart disease Paternal Uncle    Early death Paternal Uncle    Depression Maternal Grandmother    Diabetes Maternal Grandfather    Diabetes Paternal Grandfather    Colon cancer Neg Hx    Rectal cancer Neg Hx    Stomach cancer Neg Hx    Allergies as of 10/25/2022       Reactions   Codeine Nausea Only   Vicodin [hydrocodone-acetaminophen] Nausea Only        Medication List        Accurate as of October 25, 2022  8:02 AM. If you have any questions,  ask your nurse or doctor.          buPROPion 150 MG 24 hr tablet Commonly known as: WELLBUTRIN XL Take 1 tablet (150 mg total) by mouth daily.   estradiol 0.05 MG/24HR patch Commonly known as: VIVELLE-DOT 1 patch 2 (two) times a week.   pravastatin 20 MG tablet Commonly known as: PRAVACHOL Take 1 tablet (20 mg total) by mouth daily.   progesterone 100 MG capsule Commonly known as: PROMETRIUM progesterone micronized 100 mg capsule  TAKE 1 CAPSULE BY MOUTH EVERY DAY   Vitamin D (Cholecalciferol) 25 MCG (1000 UT) Caps Take 1 capsule by mouth daily.   Wegovy 1 MG/0.5ML Soaj Generic drug: Semaglutide-Weight Management Inject 1 mg into the skin once a week.        All past medical history, surgical history, allergies, family history, immunizations andmedications were updated in the EMR today and reviewed under the history and medication portions of their EMR.     ROS Negative, with the exception of above mentioned in HPI   Objective:  There were no vitals taken for this visit. There is no height or weight on file to calculate BMI.  Physical Exam   No results found. No results found. No  results found for this or any previous visit (from the past 24 hour(s)).  Assessment/Plan: [name] is a 62 y.o. female present for OV for  *** Reviewed expectations re: course of current medical issues. Discussed self-management of symptoms. Outlined signs and symptoms indicating need for more acute intervention. Patient verbalized understanding and all questions were answered. Patient received an After-Visit Summary.    No orders of the defined types were placed in this encounter.  No orders of the defined types were placed in this encounter.  Referral Orders  No referral(s) requested today     Note is dictated utilizing voice recognition software. Although note has been proof read prior to signing, occasional typographical errors still can be missed. If any questions arise, please do not hesitate to call for verification.   electronically signed by:  Howard Pouch, DO  [place]

## 2022-10-27 ENCOUNTER — Ambulatory Visit: Payer: BC Managed Care – PPO | Admitting: Family Medicine

## 2022-10-27 NOTE — Telephone Encounter (Addendum)
Patient had to cancel her appointment secondary to family emergency.   Again, increasing the dose to [facility] 1.7 is not appropriate for her.  The increased dose has caused her increase in symptoms and she cannot tolerate a higher dose and she has been went up the medication long enough that this would cause even more increased side effects.  Patient needs the 1 mg dose. Again does not make sense that she cannot pick up the 1 mg dose of Wegovy until October 2024.  I am at a loss of what the next appropriate step would be for her, with her insurance not cooperating with prescriptions in a timely manner and the medical opinion of the doctor what is appropriate for patient.

## 2022-10-27 NOTE — Telephone Encounter (Signed)
Spoke with pt and informed her of the appeal process. Pt stated that she will call ins

## 2022-11-01 ENCOUNTER — Other Ambulatory Visit: Payer: Self-pay

## 2022-11-01 NOTE — Telephone Encounter (Signed)
Again. She has not used medication in quite sometime and insurance needs to be aware there has been a lapse in use. Starting  again at an increased dose of 1.7 mg would not be medically advised.

## 2022-11-01 NOTE — Telephone Encounter (Signed)
Pt nor ins is asking to restart on 1.7 mg. Pt will just have to continue to titrate to each dose until reaches maintenance dose. The 1 mg isn't consider a maintenance dose according to FDA per ins co.

## 2022-11-01 NOTE — Telephone Encounter (Signed)
Called pt and informed her I have called ins and they stated that pt will need to inc to the maintenance dose per FDA. Pt was instructed of next step, and will complete the appeal with ins. Pt states she is fine with inc dose if necessary.   FYI

## 2022-11-02 ENCOUNTER — Other Ambulatory Visit (HOSPITAL_BASED_OUTPATIENT_CLINIC_OR_DEPARTMENT_OTHER): Payer: Self-pay

## 2022-11-02 ENCOUNTER — Encounter: Payer: Self-pay | Admitting: Family Medicine

## 2022-11-02 ENCOUNTER — Ambulatory Visit: Payer: BC Managed Care – PPO | Admitting: Family Medicine

## 2022-11-02 VITALS — BP 104/70 | HR 72 | Temp 98.3°F | Ht 66.0 in | Wt 159.0 lb

## 2022-11-02 DIAGNOSIS — Z713 Dietary counseling and surveillance: Secondary | ICD-10-CM | POA: Diagnosis not present

## 2022-11-02 DIAGNOSIS — R7309 Other abnormal glucose: Secondary | ICD-10-CM | POA: Diagnosis not present

## 2022-11-02 DIAGNOSIS — E782 Mixed hyperlipidemia: Secondary | ICD-10-CM

## 2022-11-02 DIAGNOSIS — Z6826 Body mass index (BMI) 26.0-26.9, adult: Secondary | ICD-10-CM

## 2022-11-02 DIAGNOSIS — R202 Paresthesia of skin: Secondary | ICD-10-CM | POA: Insufficient documentation

## 2022-11-02 DIAGNOSIS — F419 Anxiety disorder, unspecified: Secondary | ICD-10-CM

## 2022-11-02 MED ORDER — BUPROPION HCL ER (XL) 150 MG PO TB24: 150.0000 mg | ORAL_TABLET | Freq: Every day | ORAL | 1 refills | Status: DC

## 2022-11-02 MED ORDER — WEGOVY 0.5 MG/0.5ML ~~LOC~~ SOAJ
0.5000 mg | SUBCUTANEOUS | 0 refills | Status: DC
  Filled 2022-11-02 – 2023-02-10 (×6): qty 2, 28d supply, fill #0

## 2022-11-02 MED ORDER — WEGOVY 1 MG/0.5ML ~~LOC~~ SOAJ
1.0000 mg | SUBCUTANEOUS | 0 refills | Status: DC
  Filled 2022-11-02 – 2023-03-16 (×4): qty 2, 28d supply, fill #0

## 2022-11-02 MED ORDER — BUSPIRONE HCL 5 MG PO TABS: 5.0000 mg | ORAL_TABLET | Freq: Two times a day (BID) | ORAL | 5 refills | Status: DC

## 2022-11-02 MED ORDER — WEGOVY 0.25 MG/0.5ML ~~LOC~~ SOAJ
0.2500 mg | SUBCUTANEOUS | 0 refills | Status: DC
  Filled 2022-11-02 – 2022-11-04 (×2): qty 2, 28d supply, fill #0

## 2022-11-02 NOTE — Progress Notes (Signed)
Michelle Ochoa , 05-10-1961, 62 y.o., female MRN: [id number] Patient Care Team    Relationship Specialty Notifications Start End  Ma Hillock, DO PCP - General Family Medicine  05/20/16   Dian Queen, MD Consulting Physician Obstetrics and Gynecology  05/20/16   Doran Stabler, MD Consulting Physician Gastroenterology  02/04/20   Lyndal Pulley, DO Consulting Physician Sports Medicine  02/04/20   Star Age, MD Attending Physician Neurology  02/04/20     Chief Complaint  Patient presents with   Back Pain    Pt c/o shooting pain in upper back L side intermittently x 1 week      Subjective: Pt presents for an OV for weight loss counseling Had been doing well on the [facility] and tolerated up to 1 mg weekly injections of Wegovy.  She had elected at that time not to increase to 1.7 mg secondary to side effects to the 1 mg.  1 mg dose then became back stock, then her insurance would not approve the [facility] 1 mg continuation, therefore she has been without medication for a little over 2 months.  She would like to restart [facility].  She has gained some of her weight back. Today she weighs 159 pounds. Continue Wellbutrin 150 mg daily  Back pain/anxiety: New problem. Left upper back pain intermittently over the last week has become tingly.  She then noticed similar sensation around the bra strap area of her back.  She has had shingles in the past in the area of initial tingliness.  She states this area, is like her gauge for anxiety.  She reports she has been under a great deal of stress over the last month secondary to the caring of her elderly parents.  She denies rash, fever or prodrome fatigue.  She denies injury or decreased range of motion.     11/02/2022   10:27 AM 11/26/2021    9:38 AM 07/22/2020    1:34 PM 02/04/2020   10:09 AM 11/02/2017    3:45 PM  Depression screen PHQ 2/9  Decreased Interest 0 0 0 0 0  Down, Depressed, Hopeless 1 1 0 0 0  PHQ - 2 Score 1 1 0 0 0  Altered  sleeping  3     Tired, decreased energy  1     Change in appetite  1     Feeling bad or failure about yourself   1     Trouble concentrating  1     Moving slowly or fidgety/restless  0     Suicidal thoughts  0     PHQ-9 Score  8       Allergies  Allergen Reactions   Codeine Nausea Only   Vicodin [Hydrocodone-Acetaminophen] Nausea Only   Social History   Social History Narrative   Married, [place] 3 adult children [name], [place], [place]).   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    Takes daily vitamin   Wears seatbelt   exercises routinely.    Smoke detector in the home.    Feels safe in her relationships.    Past Medical History:  Diagnosis Date   Anemia    with 1st childbirth   Arthritis    Basal cell carcinoma    Chicken pox    Headache    History of shingles 07/2020   Insomnia    PONV (postoperative nausea and vomiting)    Tick bite 05/25/2016   Toe  fracture, right 2012   rt great toe   UTI (lower urinary tract infection)    Past Surgical History:  Procedure Laterality Date   APPENDECTOMY  2005   CESAREAN SECTION  [phone]   LAPAROSCOPIC ABDOMINAL EXPLORATION     Family History  Problem Relation Age of Onset   Breast cancer Mother        65   Dementia Mother    Prostate cancer Father    Colon polyps Father    Diabetes Paternal Uncle    Heart disease Paternal Uncle    Early death Paternal Uncle    Depression Maternal Grandmother    Diabetes Maternal Grandfather    Diabetes Paternal Grandfather    Colon cancer Neg Hx    Rectal cancer Neg Hx    Stomach cancer Neg Hx    Allergies as of 11/02/2022       Reactions   Codeine Nausea Only   Vicodin [hydrocodone-acetaminophen] Nausea Only        Medication List        Accurate as of November 02, 2022 12:04 PM. If you have any questions, ask your nurse or doctor.          STOP taking these medications    Wegovy 1 MG/0.5ML Soaj Generic drug:  Semaglutide-Weight Management Replaced by: Wegovy 0.25 MG/0.5ML Soaj Stopped by: Howard Pouch, DO       TAKE these medications    buPROPion 150 MG 24 hr tablet Commonly known as: WELLBUTRIN XL Take 1 tablet (150 mg total) by mouth daily.   busPIRone 5 MG tablet Commonly known as: BUSPAR Take 1-1.5 tablets (5-7.5 mg total) by mouth 2 (two) times daily. Started by: Howard Pouch, DO   estradiol 0.05 MG/24HR patch Commonly known as: VIVELLE-DOT 1 patch 2 (two) times a week.   pravastatin 20 MG tablet Commonly known as: PRAVACHOL Take 1 tablet (20 mg total) by mouth daily.   progesterone 100 MG capsule Commonly known as: PROMETRIUM progesterone micronized 100 mg capsule  TAKE 1 CAPSULE BY MOUTH EVERY DAY   Vitamin D (Cholecalciferol) 25 MCG (1000 UT) Caps Take 1 capsule by mouth daily.   Wegovy 0.25 MG/0.5ML Soaj Generic drug: Semaglutide-Weight Management Inject 0.25 mg into the skin once a week. Replaces: Wegovy 1 MG/0.5ML Soaj Started by: Howard Pouch, DO   Wegovy 0.5 MG/0.5ML Soaj Generic drug: Semaglutide-Weight Management Inject 0.5 mg into the skin once a week. Start taking on: November 24, 2022 Started by: Howard Pouch, DO   [name] 1 MG/0.5ML Soaj Generic drug: Semaglutide-Weight Management Inject 1 mg into the skin once a week. Start taking on: December 23, 2022 Started by: Howard Pouch, DO        All past medical history, surgical history, allergies, family history, immunizations andmedications were updated in the EMR today and reviewed under the history and medication portions of their EMR.     ROS Negative, with the exception of above mentioned in HPI  Objective:  BP 104/70   Pulse 72   Temp 98.3 F (36.8 C) (Oral)   Ht '5\' 6"'$  (1.676 m)   Wt 159 lb (72.1 kg)   SpO2 99%   BMI 25.66 kg/m  Body mass index is 25.66 kg/m. Physical Exam Vitals and nursing note reviewed.  Constitutional:      General: She is not in acute distress.    Appearance:  Normal appearance. She is normal weight. She is not ill-appearing or toxic-appearing.  Eyes:     Extraocular  Movements: Extraocular movements intact.     Conjunctiva/sclera: Conjunctivae normal.     Pupils: Pupils are equal, round, and reactive to light.  Musculoskeletal:     Right lower leg: No edema.     Left lower leg: No edema.  Skin:    General: Skin is warm.     Findings: No rash.  Neurological:     Mental Status: She is alert and oriented to person, place, and time. Mental status is at baseline.  Psychiatric:        Mood and Affect: Mood normal.        Behavior: Behavior normal.        Thought Content: Thought content normal.        Judgment: Judgment normal.     No results found. No results found. No results found for this or any previous visit (from the past 24 hour(s)).  Assessment/Plan: MAKYNLIE ROSSINI is a 62 y.o. female present for OV for  Elevated hemoglobin A1c Mixed hyperlipidemia Overweight with body mass index (BMI) of 26 in adult/weight loss counseling Overall she has been doing very well.  Unfortunately, due to insurance she has lapsed with her 51.  She was continuing to lose weight on the [facility] Avanzada 1 mg with some moderate side effects.  She has elected to not increase the dose at that time, and then her insurance would no longer approve prescription without continuing tapering despite increased symptoms.  We discussed her insurance will require her to restart from [facility] 0.25 mg q. weekly injections and taper up every 4 weeks.  She is willing to restart today. 3 prescriptions provided today for each dose up to and including Wegovy 1 mg weekly injection.  Patient is to follow-up around 10-12 weeks if desiring to continue [facility] , with the understanding that her insurance requires her to increase to 1.7 mg weekly dosing, unfortunately with no exceptions. Patient has been counseled on exercise, calorie counting, weight loss and potential medications to help with  weight loss  -Patient was provided with online resources for: Weekly net calorie calculator.  Applications for calorie counting.  Patient was advised to ensure she is taking in adequate nutrition daily by meeting calorie goals. -Patient was educated on dietary changes to not only lose weight but to eat healthy.  Patient was educated on glycemic index. -Patient was educated on exercise goal of 150 minutes a week (plus warm up and cool down) of cardiovascular exercise.   -Patient was encouraged to maintain adequate water consumption of at least 100 ounces a day, more if exercising/sweating. Follow-up 10-12 weight check and consider tapering up on Wegovy at that time.  Anxiety/paresthesia: Patient feels the paresthesia of her back is related to her anxiety levels. Exam is normal. She is under a great deal of stress and we discussed options today. She elected to keep Wellbutrin at the 150 mg XL daily She is agreeable to starting BuSpar tapering 5-7.5 mg twice daily.   Follow-up in 3 months Reviewed expectations re: course of current medical issues. Discussed self-management of symptoms. Outlined signs and symptoms indicating need for more acute intervention. Patient verbalized understanding and all questions were answered. Patient received an After-Visit Summary.    No orders of the defined types were placed in this encounter.  Meds ordered this encounter  Medications   buPROPion (WELLBUTRIN XL) 150 MG 24 hr tablet    Sig: Take 1 tablet (150 mg total) by mouth daily.    Dispense:  90 tablet  Refill:  1   busPIRone (BUSPAR) 5 MG tablet    Sig: Take 1-1.5 tablets (5-7.5 mg total) by mouth 2 (two) times daily.    Dispense:  90 tablet    Refill:  5   Semaglutide-Weight Management (WEGOVY) 0.25 MG/0.5ML SOAJ    Sig: Inject 0.25 mg into the skin once a week.    Dispense:  2 mL    Refill:  0    Restarting- script #1   Semaglutide-Weight Management (WEGOVY) 0.5 MG/0.5ML SOAJ    Sig:  Inject 0.5 mg into the skin once a week.    Dispense:  2 mL    Refill:  0    Restarting taper up-script #2   Semaglutide-Weight Management (WEGOVY) 1 MG/0.5ML SOAJ    Sig: Inject 1 mg into the skin once a week.    Dispense:  2 mL    Refill:  0    Restarting taper- script #3   Referral Orders  No referral(s) requested today     Note is dictated utilizing voice recognition software. Although note has been proof read prior to signing, occasional typographical errors still can be missed. If any questions arise, please do not hesitate to call for verification.   electronically signed by:  Howard Pouch, DO  [place]

## 2022-11-03 ENCOUNTER — Telehealth: Payer: Self-pay | Admitting: Family Medicine

## 2022-11-03 ENCOUNTER — Other Ambulatory Visit (HOSPITAL_BASED_OUTPATIENT_CLINIC_OR_DEPARTMENT_OTHER): Payer: Self-pay

## 2022-11-03 NOTE — Telephone Encounter (Signed)
Pt contacted her insurance company and they informed Michelle Ochoa that she does have approval for the 1 mg until July 9th. However they said its a PLA issue saying she has had the medication 8 times in a calendar year but she states its only been 6. She wanted me to pass the information along and provide the number to her insurance company which is [phone].

## 2022-11-04 ENCOUNTER — Other Ambulatory Visit (HOSPITAL_BASED_OUTPATIENT_CLINIC_OR_DEPARTMENT_OTHER): Payer: Self-pay

## 2022-11-04 NOTE — Telephone Encounter (Signed)
Free time I have supplied a prescription for [facility] since November for this patient there has been a problem.  I have prescribed every dose from 0.25 mg to 1 mg Wegovy, on multiple occasions for each dose since November. I saw this patient in person yesterday and again prescribed 3 prescriptions for [facility] tapering between 0.25 to 1 mg.  The pharmacy should have all of these prescriptions, at the pharmacy she requested to have the sent to.    Pharmacy can have permission to fill any of the ones now that I have sent in yesterday.   I will not be providing any further [facility] scripts, until at least one of the prescriptions that I have already called and is used.

## 2022-11-04 NOTE — Telephone Encounter (Signed)
Patient called back to check status of prescription.  Patient stated she called yesterday to inform Dr. Raoul Pitch of plan limitation for [facility].  Pharmacy states this is still not fixed.  Please call patient [phone]

## 2022-11-04 NOTE — Telephone Encounter (Signed)
noted 

## 2022-11-04 NOTE — Telephone Encounter (Signed)
Insurance has approved the restart of wegovy for 0.25 mg from 10/05/22-12/04/22. Pt will only get 2 months worth (8 pens) then will need to go up a dose. If having issues with insurance with dose change please let office know since new PA may need to be done.

## 2022-11-05 ENCOUNTER — Other Ambulatory Visit (HOSPITAL_BASED_OUTPATIENT_CLINIC_OR_DEPARTMENT_OTHER): Payer: Self-pay

## 2022-11-08 ENCOUNTER — Other Ambulatory Visit (HOSPITAL_BASED_OUTPATIENT_CLINIC_OR_DEPARTMENT_OTHER): Payer: Self-pay

## 2022-11-09 ENCOUNTER — Encounter: Payer: Self-pay | Admitting: Family Medicine

## 2022-11-19 ENCOUNTER — Other Ambulatory Visit (HOSPITAL_BASED_OUTPATIENT_CLINIC_OR_DEPARTMENT_OTHER): Payer: Self-pay

## 2022-11-23 ENCOUNTER — Other Ambulatory Visit (HOSPITAL_BASED_OUTPATIENT_CLINIC_OR_DEPARTMENT_OTHER): Payer: Self-pay

## 2022-11-24 ENCOUNTER — Other Ambulatory Visit: Payer: Self-pay

## 2022-11-25 DIAGNOSIS — H40013 Open angle with borderline findings, low risk, bilateral: Secondary | ICD-10-CM | POA: Diagnosis not present

## 2022-12-17 ENCOUNTER — Telehealth: Payer: BC Managed Care – PPO

## 2022-12-17 ENCOUNTER — Other Ambulatory Visit (HOSPITAL_BASED_OUTPATIENT_CLINIC_OR_DEPARTMENT_OTHER): Payer: Self-pay

## 2022-12-20 ENCOUNTER — Other Ambulatory Visit (HOSPITAL_COMMUNITY): Payer: Self-pay

## 2022-12-21 NOTE — Telephone Encounter (Signed)
This is what I see when I go in to that [facility] to finish the PA. [name] has been denied previously.

## 2022-12-21 NOTE — Telephone Encounter (Signed)
noted 

## 2023-01-04 ENCOUNTER — Other Ambulatory Visit (HOSPITAL_BASED_OUTPATIENT_CLINIC_OR_DEPARTMENT_OTHER): Payer: Self-pay

## 2023-01-10 ENCOUNTER — Other Ambulatory Visit (HOSPITAL_BASED_OUTPATIENT_CLINIC_OR_DEPARTMENT_OTHER): Payer: Self-pay

## 2023-01-11 ENCOUNTER — Other Ambulatory Visit (HOSPITAL_BASED_OUTPATIENT_CLINIC_OR_DEPARTMENT_OTHER): Payer: Self-pay

## 2023-01-11 DIAGNOSIS — L821 Other seborrheic keratosis: Secondary | ICD-10-CM | POA: Diagnosis not present

## 2023-01-11 DIAGNOSIS — D225 Melanocytic nevi of trunk: Secondary | ICD-10-CM | POA: Diagnosis not present

## 2023-01-11 DIAGNOSIS — L570 Actinic keratosis: Secondary | ICD-10-CM | POA: Diagnosis not present

## 2023-01-11 DIAGNOSIS — L814 Other melanin hyperpigmentation: Secondary | ICD-10-CM | POA: Diagnosis not present

## 2023-01-11 DIAGNOSIS — Z85828 Personal history of other malignant neoplasm of skin: Secondary | ICD-10-CM | POA: Diagnosis not present

## 2023-02-01 ENCOUNTER — Ambulatory Visit: Payer: BC Managed Care – PPO | Admitting: Family Medicine

## 2023-02-01 ENCOUNTER — Encounter: Payer: Self-pay | Admitting: Family Medicine

## 2023-02-01 VITALS — BP 117/78 | HR 64 | Temp 97.8°F | Wt 160.0 lb

## 2023-02-01 DIAGNOSIS — Z713 Dietary counseling and surveillance: Secondary | ICD-10-CM | POA: Diagnosis not present

## 2023-02-01 DIAGNOSIS — E559 Vitamin D deficiency, unspecified: Secondary | ICD-10-CM

## 2023-02-01 DIAGNOSIS — F419 Anxiety disorder, unspecified: Secondary | ICD-10-CM

## 2023-02-01 MED ORDER — PRAVASTATIN SODIUM 20 MG PO TABS: 20.0000 mg | ORAL_TABLET | Freq: Every day | ORAL | 3 refills | Status: DC

## 2023-02-01 MED ORDER — BUPROPION HCL ER (XL) 150 MG PO TB24: 150.0000 mg | ORAL_TABLET | Freq: Every day | ORAL | 1 refills | Status: DC

## 2023-02-01 NOTE — Patient Instructions (Addendum)
The Reginal Lutes issue is a Community education officer between you and your insurance.  If they will not agree to cover all scripts consecutively, then we can not call in more scripts for you.

## 2023-02-01 NOTE — Telephone Encounter (Signed)
[phone] Pt states that insurance is need PA for plan limit PA to have [facility] at the 0.5 mg and 1 mg  Please call number above for more information

## 2023-02-01 NOTE — Progress Notes (Signed)
Michelle Ochoa , 07/03/1961, 62 y.o., female MRN: [id number] Patient Care Team    Relationship Specialty Notifications Start End  Natalia Leatherwood, DO PCP - General Family Medicine  05/20/16   Marcelle Overlie, MD Consulting Physician Obstetrics and Gynecology  05/20/16   Sherrilyn Rist, MD Consulting Physician Gastroenterology  02/04/20   Judi Saa, DO Consulting Physician Sports Medicine  02/04/20   Huston Foley, MD Attending Physician Neurology  02/04/20     Chief Complaint  Patient presents with   management of chronic conditions    Needs plan limit authorization for wegovy     Subjective: Pt presents for an OV for weight loss counseling Had been doing well on the [facility] and tolerated up to 1 mg weekly injections of Wegovy.  She had elected at that time not to increase to 1.7 mg secondary to side effects to the 1 mg.  1 mg dose then became back stock, then her insurance would not approve the [facility] 1 mg continuation, therefore she had been without medication .  This visit we were able to get her Wegovy 0.25 mg restarted, and then her insurance would not cover the Wegovy 0.5 mg because she had already had a prescription of this within their contract limit, which sounds like 1 year. Today she weighs 159 >160 Patient reports she is only allowed to be on a particular dose of the [facility] for 1 pen, during a 69-month period.     11/02/2022   10:27 AM 11/26/2021    9:38 AM 07/22/2020    1:34 PM 02/04/2020   10:09 AM 11/02/2017    3:45 PM  Depression screen PHQ 2/9  Decreased Interest 0 0 0 0 0  Down, Depressed, Hopeless 1 1 0 0 0  PHQ - 2 Score 1 1 0 0 0  Altered sleeping  3     Tired, decreased energy  1     Change in appetite  1     Feeling bad or failure about yourself   1     Trouble concentrating  1     Moving slowly or fidgety/restless  0     Suicidal thoughts  0     PHQ-9 Score  8       Allergies  Allergen Reactions   Codeine Nausea Only   Vicodin  [Hydrocodone-Acetaminophen] Nausea Only   Social History   Social History Narrative   Mar[place], [place] 3 adult children Michelle Ochoa, [place], [place]).   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    Takes daily vitamin   Wears seatbelt   exercises routinely.    Smoke detector in the home.    Feels safe in her relationships.    Past Medical History:  Diagnosis Date   Anemia    with 1st childbirth   Arthritis    Basal cell carcinoma    Chicken pox    Headache    History of shingles 07/2020   Insomnia    PONV (postoperative nausea and vomiting)    Tick bite 05/25/2016   Toe fracture, right 2012   rt great toe   UTI (lower urinary tract infection)    Past Surgical History:  Procedure Laterality Date   APPENDECTOMY  2005   CESAREAN SECTION  [phone]   LAPAROSCOPIC ABDOMINAL EXPLORATION     Family History  Problem Relation Age of Onset   Breast cancer Mother  77   Dementia Mother    Prostate cancer Father    Colon polyps Father    Diabetes Paternal Uncle    Heart disease Paternal Uncle    Early death Paternal Uncle    Depression Maternal Grandmother    Diabetes Maternal Grandfather    Diabetes Paternal Grandfather    Colon cancer Neg Hx    Rectal cancer Neg Hx    Stomach cancer Neg Hx    Allergies as of 02/01/2023       Reactions   Codeine Nausea Only   Vicodin [hydrocodone-acetaminophen] Nausea Only        Medication List        Accurate as of February 01, 2023 11:50 AM. If you have any questions, ask your nurse or doctor.          buPROPion 150 MG 24 hr tablet Commonly known as: WELLBUTRIN XL Take 1 tablet (150 mg total) by mouth daily.   busPIRone 5 MG tablet Commonly known as: BUSPAR Take 1-1.5 tablets (5-7.5 mg total) by mouth 2 (two) times daily.   estradiol 0.05 MG/24HR patch Commonly known as: VIVELLE-DOT 1 patch 2 (two) times a week.   pravastatin 20 MG tablet Commonly known as: PRAVACHOL Take 1  tablet (20 mg total) by mouth daily.   progesterone 100 MG capsule Commonly known as: PROMETRIUM progesterone micronized 100 mg capsule  TAKE 1 CAPSULE BY MOUTH EVERY DAY   Vitamin D (Cholecalciferol) 25 MCG (1000 UT) Caps Take 1 capsule by mouth daily.   Wegovy 0.25 MG/0.5ML Soaj Generic drug: Semaglutide-Weight Management Inject 0.25 mg into the skin once a week.   Wegovy 0.5 MG/0.5ML Soaj Generic drug: Semaglutide-Weight Management Inject 0.5 mg into the skin once a week.   Wegovy 1 MG/0.5ML Soaj Generic drug: Semaglutide-Weight Management Inject 1 mg into the skin once a week.        All past medical history, surgical history, allergies, family history, immunizations andmedications were updated in the EMR today and reviewed under the history and medication portions of their EMR.     ROS Negative, with the exception of above mentioned in HPI  Objective:  BP 117/78   Pulse 64   Temp 97.8 F (36.6 C)   Wt 160 lb (72.6 kg)   SpO2 97%   BMI 25.82 kg/m  Body mass index is 25.82 kg/m. Physical Exam Vitals and nursing note reviewed.  Constitutional:      General: She is not in acute distress.    Appearance: Normal appearance. She is normal weight. She is not ill-appearing or toxic-appearing.  Eyes:     Extraocular Movements: Extraocular movements intact.     Conjunctiva/sclera: Conjunctivae normal.     Pupils: Pupils are equal, round, and reactive to light.  Musculoskeletal:     Right lower leg: No edema.     Left lower leg: No edema.  Skin:    General: Skin is warm.     Findings: No rash.  Neurological:     Mental Status: She is alert and oriented to person, place, and time. Mental status is at baseline.  Psychiatric:        Mood and Affect: Mood normal.        Behavior: Behavior normal.        Thought Content: Thought content normal.        Judgment: Judgment normal.     No results found. No results found. No results found for this or any previous  visit (  from the past 24 hour(s)).  Assessment/Plan: [name] is a 62 y.o. female present for OV for  Elevated hemoglobin A1c Mixed hyperlipidemia Overweight with body mass index (BMI) of 26 in adult/weight loss counseling Overall she has been doing very well.  Unfortunately, due to insurance she has lapsed with her Reginal Lutes, again.  Her insurance stated prior to that they would cover it if she restarted the tapering.  We did this and they still declined her the 0.5 mg of Wegovy.  Long discussion today with patient.  Her insurance company reneged on the agreement that if we restarted the tapering they would cover the prescription.  We have completed everything that was asked, and they still have interrupted her tapering of Wegovy.  Our office will send this to our authorization team, and if this cannot be resolved within the next prescription then we will no longer be able to continue to attempt to provide her prescriptions with [facility]. It sounds like she may still have 2 prescriptions available to her that were already written last visit-which are the [facility] 0.5 and 1 mg doses.  Patient understands that her insurance requires her to increase to 1.7 mg weekly dosing, unfortunately with no exceptions.  Therefore if this is approved and she understands she will have to continue to increase to the 1.7 mg weekly dosing  Patient has been counseled on exercise, calorie counting, weight loss and potential medications to help with weight loss  -Patient was provided with online resources for: Weekly net calorie calculator.  Applications for calorie counting.  Patient was advised to ensure she is taking in adequate nutrition daily by meeting calorie goals. -Patient was educated on dietary changes to not only lose weight but to eat healthy.  Patient was educated on glycemic index. -Patient was educated on exercise goal of 150 minutes a week (plus warm up and cool down) of cardiovascular exercise.    -Patient was encouraged to maintain adequate water consumption of at least 100 ounces a day, more if exercising/sweating.  Anxiety She elected to keep Wellbutrin at the 150 mg XL daily Did not start buspar  Follow up in 8 weeks- if wegovy covered, if not covered then she will need followup in 5.5 mos of cmc  Reviewed expectations re: course of current medical issues. Discussed self-management of symptoms. Outlined signs and symptoms indicating need for more acute intervention. Patient verbalized understanding and all questions were answered. Patient received an After-Visit Summary.    No orders of the defined types were placed in this encounter.  Meds ordered this encounter  Medications   buPROPion (WELLBUTRIN XL) 150 MG 24 hr tablet    Sig: Take 1 tablet (150 mg total) by mouth daily.    Dispense:  90 tablet    Refill:  1   pravastatin (PRAVACHOL) 20 MG tablet    Sig: Take 1 tablet (20 mg total) by mouth daily.    Dispense:  90 tablet    Refill:  3   Referral Orders  No referral(s) requested today     Note is dictated utilizing voice recognition software. Although note has been proof read prior to signing, occasional typographical errors still can be missed. If any questions arise, please do not hesitate to call for verification.   electronically signed by:  Felix Pacini, DO  [facility] - [place]

## 2023-02-08 ENCOUNTER — Telehealth: Payer: Self-pay | Admitting: Family Medicine

## 2023-02-08 NOTE — Telephone Encounter (Signed)
Michelle Ochoa E15 minutes ago (9:15 AM)   IM Ms. Sannes called to discuss wegovy. She reports that she would like a status update regarding the approval of the amount. She says this is different from the prior Auth team and [name] is aware of what was discussed previously.

## 2023-02-08 NOTE — Telephone Encounter (Signed)
Ms. Gienger called to discuss wegovy. She reports that she would like a status update regarding the approval of the amount. She says this is different from the prior Auth team and [name] is aware of what was discussed previously.

## 2023-02-08 NOTE — Telephone Encounter (Signed)
Please advise on PA for plan limit

## 2023-02-08 NOTE — Telephone Encounter (Signed)
Please see other encounter.

## 2023-02-09 ENCOUNTER — Other Ambulatory Visit (HOSPITAL_COMMUNITY): Payer: Self-pay

## 2023-02-09 NOTE — Telephone Encounter (Signed)
Patient checking status of PA.  She has been told by her insurance has approved covering [place] per plan limitations. ??  I told patient when we had an answer, someone would be calling her with an update.

## 2023-02-10 ENCOUNTER — Other Ambulatory Visit (HOSPITAL_BASED_OUTPATIENT_CLINIC_OR_DEPARTMENT_OTHER): Payer: Self-pay

## 2023-02-10 NOTE — Telephone Encounter (Signed)
Spoke with patient regarding results/recommendations.  

## 2023-02-10 NOTE — Telephone Encounter (Signed)
To the best of my understanding, the plan limitations are 2ml per year, basically, they will only cover for a month out of the year, even though PA is "approved". We've been seeing this recently with weight loss drugs. Insurances are always coming up with something new.

## 2023-02-10 NOTE — Telephone Encounter (Signed)
McCormick, Peter Garter, CPhT  You1 hour ago (9:40 AM)   To the best of my understanding, the plan limitations are 2ml per year, basically, they will only cover for a month out of the year, even though PA is "approved". We've been seeing this recently with weight loss drugs. Insurances are always coming up with something new.

## 2023-02-11 NOTE — Telephone Encounter (Signed)
When attempting PA through provided key code for 0.5mg , I receive following message:  When attempting PA through provided key code for 1mg , I receive following message:   Please be advised we currently do not have a Pharmacist to review denials, therefore you will need to process appeals accordingly as needed. Thanks for your support at this time.

## 2023-02-16 ENCOUNTER — Other Ambulatory Visit (HOSPITAL_BASED_OUTPATIENT_CLINIC_OR_DEPARTMENT_OTHER): Payer: Self-pay

## 2023-02-16 NOTE — Telephone Encounter (Signed)
Michelle Ochoa states she spoke with [employer] and wanted to speak with [name] regarding an update she has from them. She ask that [name] giver her a call back to discuss.  No additional details were provided.

## 2023-02-16 NOTE — Telephone Encounter (Signed)
Both approved for 0.5 and 1 mg until 03/18/23. Pt has been notified

## 2023-02-16 NOTE — Telephone Encounter (Addendum)
Pt stated she was told by insurance we could call [phone] to complete PA for plan limitation. ID # [id number]  CoverMyMeds Key # for 0.5mg  ([id number]) and key # for 1mg  ([id number]). Both PA's were denied.  Case ID:[id number];Status:Denied;Appeal Information: Attention:ATTN: CLINICAL APPEALS DEPARTMENT [employer] Phone:[phone]

## 2023-03-09 ENCOUNTER — Encounter: Payer: Self-pay | Admitting: Family Medicine

## 2023-03-09 ENCOUNTER — Ambulatory Visit: Payer: BC Managed Care – PPO | Admitting: Family Medicine

## 2023-03-09 VITALS — BP 122/79 | HR 64 | Temp 97.8°F | Wt 157.8 lb

## 2023-03-09 DIAGNOSIS — R21 Rash and other nonspecific skin eruption: Secondary | ICD-10-CM

## 2023-03-09 DIAGNOSIS — S20469A Insect bite (nonvenomous) of unspecified back wall of thorax, initial encounter: Secondary | ICD-10-CM

## 2023-03-09 MED ORDER — TRIAMCINOLONE ACETONIDE 0.1 % EX CREA: 1.0000 | TOPICAL_CREAM | Freq: Two times a day (BID) | CUTANEOUS | 0 refills | Status: AC

## 2023-03-09 NOTE — Patient Instructions (Addendum)
Return if symptoms worsen or fail to improve.        Great to see you today.    

## 2023-03-09 NOTE — Progress Notes (Signed)
Michelle Ochoa , [birth year], 62 y.o., female MRN: [id number] Patient Care Team    Relationship Specialty Notifications Start End  Natalia Leatherwood, DO PCP - General Family Medicine  05/20/16   Marcelle Overlie, MD Consulting Physician Obstetrics and Gynecology  05/20/16   Sherrilyn Rist, MD Consulting Physician Gastroenterology  02/04/20   Judi Saa, DO Consulting Physician Sports Medicine  02/04/20   Huston Foley, MD Attending Physician Neurology  02/04/20     Chief Complaint  Patient presents with   Rash    Had shingles 2 years ago. Thought it might be bug bites due to going on a hike. Started a month ago, eased up, now started itching again 2 days ago. Started taking meds given a year ago 2 days ago. On the right side of back. Itching and some pain.      Subjective: Michelle Ochoa is a 62 y.o. Pt presents for an OV with complaints of rash of 4 weeks duration.  Patient reports she initially had a rash right lateral thoracic about 4 weeks ago.  She noticed after she stated her friend's house in [place] and was hiking.  She initially thought maybe she had some bug bites, then became more concerned maybe she was getting shingles again.  She had shingles 2 years ago on the left thoracic area.  She reports that this particular rash has not caused any fevers and the pain is not like it had been when she had shingles.     03/09/2023   11:20 AM 11/02/2022   10:27 AM 11/26/2021    9:38 AM 07/22/2020    1:34 PM 02/04/2020   10:09 AM  Depression screen PHQ 2/9  Decreased Interest 1 0 0 0 0  Down, Depressed, Hopeless 0 1 1 0 0  PHQ - 2 Score 1 1 1  0 0  Altered sleeping 2  3    Tired, decreased energy 1  1    Change in appetite 1  1    Feeling bad or failure about yourself  0  1    Trouble concentrating 0  1    Moving slowly or fidgety/restless 0  0    Suicidal thoughts 0  0    PHQ-9 Score 5  8    Difficult doing work/chores Not difficult at all        Allergies  Allergen  Reactions   Codeine Nausea Only   Vicodin [Hydrocodone-Acetaminophen] Nausea Only   Social History   Social History Narrative   [place], [place] 3 adult children Wenda Low, [place], [place]).   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    Takes daily vitamin   Wears seatbelt   exercises routinely.    Smoke detector in the home.    Feels safe in her relationships.    Past Medical History:  Diagnosis Date   Anemia    with 1st childbirth   Arthritis    Basal cell carcinoma    Chicken pox    Headache    History of shingles 07/2020   Insomnia    PONV (postoperative nausea and vomiting)    Tick bite 05/25/2016   Toe fracture, right 2012   rt great toe   UTI (lower urinary tract infection)    Past Surgical History:  Procedure Laterality Date   APPENDECTOMY  2005   CESAREAN SECTION  [phone]   LAPAROSCOPIC ABDOMINAL EXPLORATION     Family History  Problem Relation Age of Onset   Breast cancer Mother        72   Dementia Mother    Prostate cancer Father    Colon polyps Father    Diabetes Paternal Uncle    Heart disease Paternal Uncle    Early death Paternal Uncle    Depression Maternal Grandmother    Diabetes Maternal Grandfather    Diabetes Paternal Grandfather    Colon cancer Neg Hx    Rectal cancer Neg Hx    Stomach cancer Neg Hx    Allergies as of 03/09/2023       Reactions   Codeine Nausea Only   Vicodin [hydrocodone-acetaminophen] Nausea Only        Medication List        Accurate as of Mar 09, 2023 11:46 AM. If you have any questions, ask your nurse or doctor.          STOP taking these medications    busPIRone 5 MG tablet Commonly known as: BUSPAR Stopped by: Felix Pacini, DO   pravastatin 20 MG tablet Commonly known as: PRAVACHOL Stopped by: Felix Pacini, DO       TAKE these medications    buPROPion 150 MG 24 hr tablet Commonly known as: WELLBUTRIN XL Take 1 tablet (150 mg total) by mouth daily.    estradiol 0.05 MG/24HR patch Commonly known as: VIVELLE-DOT 1 patch 2 (two) times a week.   progesterone 100 MG capsule Commonly known as: PROMETRIUM progesterone micronized 100 mg capsule  TAKE 1 CAPSULE BY MOUTH EVERY DAY   triamcinolone cream 0.1 % Commonly known as: KENALOG Apply 1 Application topically 2 (two) times daily. Started by: Felix Pacini, DO   Vitamin D (Cholecalciferol) 25 MCG (1000 UT) Caps Take 1 capsule by mouth daily.   Wegovy 0.25 MG/0.5ML Soaj Generic drug: Semaglutide-Weight Management Inject 0.25 mg into the skin once a week. What changed: Another medication with the same name was removed. Continue taking this medication, and follow the directions you see here. Changed by: Felix Pacini, DO   Wegovy 0.5 MG/0.5ML Soaj Generic drug: Semaglutide-Weight Management Inject 0.5 mg into the skin once a week. What changed: Another medication with the same name was removed. Continue taking this medication, and follow the directions you see here. Changed by: Felix Pacini, DO   MJ.Hock 1 MG/0.5ML Soaj Generic drug: Semaglutide-Weight Management Inject 1 mg into the skin once a week. What changed: Another medication with the same name was removed. Continue taking this medication, and follow the directions you see here. Changed by: Felix Pacini, DO        All past medical history, surgical history, allergies, family history, immunizations andmedications were updated in the EMR today and reviewed under the history and medication portions of their EMR.     ROS Negative, with the exception of above mentioned in HPI   Objective:  BP 122/79   Pulse 64   Temp 97.8 F (36.6 C)   Wt 157 lb 12.8 oz (71.6 kg)   SpO2 96%   BMI 25.47 kg/m  Body mass index is 25.47 kg/m. Physical Exam Vitals and nursing note reviewed.  Constitutional:      General: She is not in acute distress.    Appearance: Normal appearance. She is normal weight. She is not ill-appearing or  toxic-appearing.  HENT:     Head: Normocephalic and atraumatic.  Eyes:     General: No scleral icterus.       Right eye:  No discharge.        Left eye: No discharge.     Extraocular Movements: Extraocular movements intact.     Conjunctiva/sclera: Conjunctivae normal.     Pupils: Pupils are equal, round, and reactive to light.  Skin:    Findings: Rash (X 4 single closed comedones and by mild erythema along bra line on right thoracic and 1 of 4 left lower lumbar) present.  Neurological:     Mental Status: She is alert and oriented to person, place, and time. Mental status is at baseline.     Motor: No weakness.     Coordination: Coordination normal.     Gait: Gait normal.  Psychiatric:        Mood and Affect: Mood normal.        Behavior: Behavior normal.        Thought Content: Thought content normal.        Judgment: Judgment normal.     No results found. No results found. No results found for this or any previous visit (from the past 24 hour(s)).  Assessment/Plan: VICTORIAANN EICHELBERGER is a 62 y.o. female present for OV for  Insect bite/rash: Areas appear more consistent with an insect bite.  She reports she stayed overnight in [place], so it could be bug bites, or something from her hike. Discontinue Valtrex-reassured this is not shingles Start Kenalog cream twice daily Follow-up as needed  Reviewed expectations re: course of current medical issues. Discussed self-management of symptoms. Outlined signs and symptoms indicating need for more acute intervention. Patient verbalized understanding and all questions were answered. Patient received an After-Visit Summary.    No orders of the defined types were placed in this encounter.  Meds ordered this encounter  Medications   triamcinolone cream (KENALOG) 0.1 %    Sig: Apply 1 Application topically 2 (two) times daily.    Dispense:  45 g    Refill:  0   Referral Orders  No referral(s) requested today     Note is  dictated utilizing voice recognition software. Although note has been proof read prior to signing, occasional typographical errors still can be missed. If any questions arise, please do not hesitate to call for verification.   electronically signed by:  Felix Pacini, DO  [facility] - [place]

## 2023-03-11 ENCOUNTER — Telehealth: Payer: Self-pay

## 2023-03-11 MED ORDER — VALACYCLOVIR HCL 1 G PO TABS: 1000.0000 mg | ORAL_TABLET | Freq: Three times a day (TID) | ORAL | 0 refills | Status: AC

## 2023-03-11 NOTE — Telephone Encounter (Signed)
Unable to reach pt by phone. Sent mychart advising her of 1 week refill.

## 2023-03-11 NOTE — Telephone Encounter (Signed)
Patient was just seen this week.  Her rash is not consistent with shingles.  Shingles does not affect multiple areas of the body.  I will refill her Valtrex for 1 week prescription for her to take per her request.  Would not recommend any further treatment with this medication.  Rash is more consistent with insect bites or bedbugs.

## 2023-03-11 NOTE — Telephone Encounter (Signed)
Spoke to pt and she states she is having the same issue of what my be shingles on her other arm. She was taking an old prescription for Valtrex and is now out. She wants to see if this can be refilled and sent to the [facility] in [place].

## 2023-03-11 NOTE — Telephone Encounter (Signed)
Retrieved voicemail from patient.  She is requesting refill on prescription.  Phone broke up so not sure what name of medication is, I heard "shingles" at the end.  Please call [phone] and advise.

## 2023-03-16 ENCOUNTER — Other Ambulatory Visit (HOSPITAL_BASED_OUTPATIENT_CLINIC_OR_DEPARTMENT_OTHER): Payer: Self-pay

## 2023-03-17 ENCOUNTER — Other Ambulatory Visit (HOSPITAL_BASED_OUTPATIENT_CLINIC_OR_DEPARTMENT_OTHER): Payer: Self-pay

## 2023-03-18 ENCOUNTER — Encounter: Payer: Self-pay | Admitting: Family Medicine

## 2023-03-18 ENCOUNTER — Ambulatory Visit: Payer: BC Managed Care – PPO | Admitting: Family Medicine

## 2023-03-18 VITALS — BP 128/78 | HR 60 | Temp 98.5°F | Resp 17 | Ht 66.0 in | Wt 156.5 lb

## 2023-03-18 DIAGNOSIS — S20469A Insect bite (nonvenomous) of unspecified back wall of thorax, initial encounter: Secondary | ICD-10-CM | POA: Diagnosis not present

## 2023-03-18 DIAGNOSIS — R5383 Other fatigue: Secondary | ICD-10-CM

## 2023-03-18 LAB — HEPATIC FUNCTION PANEL
ALT: 16 U/L (ref 0–35)
AST: 20 U/L (ref 0–37)
Albumin: 4.4 g/dL (ref 3.5–5.2)
Alkaline Phosphatase: 48 U/L (ref 39–117)
Bilirubin, Direct: 0.1 mg/dL (ref 0.0–0.3)
Total Bilirubin: 0.8 mg/dL (ref 0.2–1.2)
Total Protein: 7.3 g/dL (ref 6.0–8.3)

## 2023-03-18 LAB — CBC WITH DIFFERENTIAL/PLATELET
Basophils Absolute: 0 10*3/uL (ref 0.0–0.1)
Basophils Relative: 0.9 % (ref 0.0–3.0)
Eosinophils Absolute: 0.1 10*3/uL (ref 0.0–0.7)
Eosinophils Relative: 1.6 % (ref 0.0–5.0)
HCT: 41.4 % (ref 36.0–46.0)
Hemoglobin: 13.7 g/dL (ref 12.0–15.0)
Lymphocytes Relative: 33 % (ref 12.0–46.0)
Lymphs Abs: 1.5 10*3/uL (ref 0.7–4.0)
MCHC: 33.2 g/dL (ref 30.0–36.0)
MCV: 90.3 fl (ref 78.0–100.0)
Monocytes Absolute: 0.4 10*3/uL (ref 0.1–1.0)
Monocytes Relative: 7.9 % (ref 3.0–12.0)
Neutro Abs: 2.6 10*3/uL (ref 1.4–7.7)
Neutrophils Relative %: 56.6 % (ref 43.0–77.0)
Platelets: 194 10*3/uL (ref 150.0–400.0)
RBC: 4.58 Mil/uL (ref 3.87–5.11)
RDW: 13.4 % (ref 11.5–15.5)
WBC: 4.6 10*3/uL (ref 4.0–10.5)

## 2023-03-18 LAB — BASIC METABOLIC PANEL
BUN: 11 mg/dL (ref 6–23)
CO2: 29 mEq/L (ref 19–32)
Calcium: 9.5 mg/dL (ref 8.4–10.5)
Chloride: 102 mEq/L (ref 96–112)
Creatinine, Ser: 0.93 mg/dL (ref 0.40–1.20)
GFR: 67.23 mL/min (ref >60.00)
Glucose, Bld: 89 mg/dL (ref 70–99)
Potassium: 4.5 mEq/L (ref 3.5–5.1)
Sodium: 136 mEq/L (ref 135–145)

## 2023-03-18 LAB — B12 AND FOLATE PANEL
Folate: 13.1 ng/mL (ref >5.9)
Vitamin B-12: 287 pg/mL (ref 211–911)

## 2023-03-18 LAB — TSH: TSH: 1.69 u[IU]/mL (ref 0.35–5.50)

## 2023-03-18 LAB — VITAMIN D 25 HYDROXY (VIT D DEFICIENCY, FRACTURES): VITD: 37.6 ng/mL (ref 30.00–100.00)

## 2023-03-18 NOTE — Patient Instructions (Signed)
Follow up w/ your PCP as needed or as scheduled We'll notify you of your lab results and make any changes if needed USE the Triamcinolone that Dr Claiborne Billings prescribed on the bites to help w/ itching (which will prevent you from scratching them open and speed the healing process) Make sure you are taking care of you while taking care of everyone else Call with any questions or concerns Hang in there!

## 2023-03-18 NOTE — Progress Notes (Signed)
   Subjective:    Patient ID: Michelle Ochoa, female    DOB: 1961/03/13, 62 y.o.   MRN: [id number]  HPI Bug bites- was seen on 5/22 for this.  Was tx'd w/ Triamcinolone- but pt has not used this.  Initially thought it was shingles but PCP reassured her this was not the case.  Sxs started after spending the weekend in [place].  Pt continues to scratch at the areas and has unroofed them multiple times.  Itching will come and go.  Fatigue- new.  since the bites/itching started.  Pt reports excessive fatigue.  Sleep is fragmented.  Pt reports high levels of stress- parents, husband, and kids.  Asking for blood work.  No CP, SOB.  + family hx of thyroid disorder   Review of Systems For ROS see HPI     Objective:   Physical Exam Vitals reviewed.  Constitutional:      General: She is not in acute distress.    Appearance: Normal appearance. She is not ill-appearing.  HENT:     Head: Normocephalic and atraumatic.  Eyes:     Extraocular Movements: Extraocular movements intact.     Conjunctiva/sclera: Conjunctivae normal.  Cardiovascular:     Rate and Rhythm: Normal rate and regular rhythm.     Pulses: Normal pulses.     Heart sounds: Normal heart sounds.  Pulmonary:     Effort: Pulmonary effort is normal. No respiratory distress.     Breath sounds: Normal breath sounds. No wheezing or rhonchi.  Abdominal:     General: There is no distension.     Palpations: Abdomen is soft.     Tenderness: There is no abdominal tenderness.  Musculoskeletal:     Cervical back: Neck supple.  Lymphadenopathy:     Cervical: No cervical adenopathy.  Skin:    General: Skin is warm and dry.     Findings: Lesion (4 lesions in linear pattern on R mid back.  2 are pustules, 1 is scabbed, 1 is unroofed.  smaller erythematous lesion on L lower back) present.  Neurological:     General: No focal deficit present.     Mental Status: She is alert and oriented to person, place, and time.  Psychiatric:      Comments: anxious           Assessment & Plan:   Insect bite back- new to provider, not new to patient.  She reports areas are still intermittently itchy and admits to scratching the tops of them off and any scab that may be present.  I told her this will delay healing as each time she does this, a new scab has to form.  She has not used the Triamcinolone cream that was prescribed.  Encouraged her to do so.  Reaffirmed that this does not look like shingles and is more consistent w/ bites.  Fatigue- new.  Pt states this has been markedly worse since sustaining the bug bites while traveling.  She admits to high stress levels in dealing w/ her parents, husband, and children.  Is very concerned that she is developing health issues.  Discussed physical causes of fatigue and emotional causes.  Will check labs to assess for physical causes and if these are all normal, told her to discuss anxiety/depression w/ PCP.  Pt expressed understanding and is in agreement w/ plan.

## 2023-03-21 ENCOUNTER — Telehealth: Payer: Self-pay

## 2023-03-21 NOTE — Telephone Encounter (Signed)
Pt is aware of results. 

## 2023-03-21 NOTE — Telephone Encounter (Signed)
-----   Message from Sheliah Hatch, MD sent at 03/21/2023  7:39 AM EDT ----- Labs are all normal- this is great news!  Waiting on the Lyme test results as these take longer.  Please follow up w/ Dr Claiborne Billings regarding your fatigue as this may be stress related/emotional.

## 2023-03-22 ENCOUNTER — Telehealth: Payer: Self-pay

## 2023-03-22 LAB — LYME DISEASE SEROLOGY W/REFLEX: Lyme Total Antibody EIA: NEGATIVE

## 2023-03-22 NOTE — Telephone Encounter (Signed)
-----   Message from Sheliah Hatch, MD sent at 03/22/2023  9:52 AM EDT ----- Negative for lyme disease- great news!

## 2023-04-18 ENCOUNTER — Other Ambulatory Visit (HOSPITAL_BASED_OUTPATIENT_CLINIC_OR_DEPARTMENT_OTHER): Payer: Self-pay

## 2023-04-18 ENCOUNTER — Other Ambulatory Visit: Payer: Self-pay | Admitting: Family Medicine

## 2023-04-18 ENCOUNTER — Other Ambulatory Visit (HOSPITAL_COMMUNITY): Payer: Self-pay

## 2023-04-19 NOTE — Telephone Encounter (Signed)
Michelle Ochoa, please call pharmacy and check that this patient has had [facility] prescription filled within the last 4-6 weeks to prove active prescription is being taken.  I was under the impression the medications prescribed to her during her last visit were not covered and denied again.

## 2023-04-20 ENCOUNTER — Other Ambulatory Visit (HOSPITAL_BASED_OUTPATIENT_CLINIC_OR_DEPARTMENT_OTHER): Payer: Self-pay

## 2023-04-22 ENCOUNTER — Ambulatory Visit: Payer: BC Managed Care – PPO | Admitting: Family Medicine

## 2023-04-22 ENCOUNTER — Other Ambulatory Visit (HOSPITAL_BASED_OUTPATIENT_CLINIC_OR_DEPARTMENT_OTHER): Payer: Self-pay

## 2023-04-22 VITALS — BP 105/69 | HR 64 | Temp 97.8°F | Wt 153.2 lb

## 2023-04-22 DIAGNOSIS — Z713 Dietary counseling and surveillance: Secondary | ICD-10-CM | POA: Diagnosis not present

## 2023-04-22 MED ORDER — WEGOVY 2.4 MG/0.75ML ~~LOC~~ SOAJ: 2.4000 mg | SUBCUTANEOUS | 5 refills | Status: DC

## 2023-04-22 MED ORDER — BUPROPION HCL ER (XL) 150 MG PO TB24: 150.0000 mg | ORAL_TABLET | Freq: Every day | ORAL | 1 refills | Status: DC

## 2023-04-22 MED ORDER — WEGOVY 1.7 MG/0.75ML ~~LOC~~ SOAJ: 1.7000 mg | SUBCUTANEOUS | 0 refills | Status: DC

## 2023-04-22 NOTE — Progress Notes (Signed)
Michelle Ochoa , 26-Jul-1961, 62 y.o., female MRN: [id number] Patient Care Team    Relationship Specialty Notifications Start End  Michelle Leatherwood, DO PCP - General Family Medicine  05/20/16   Michelle Overlie, MD Consulting Physician Obstetrics and Gynecology  05/20/16   Michelle Rist, MD Consulting Physician Gastroenterology  02/04/20   Michelle Saa, DO Consulting Physician Sports Medicine  02/04/20   Michelle Foley, MD Attending Physician Neurology  02/04/20     Chief Complaint  Patient presents with   Weight Check    Last dose of wegovy 1 week ago. Currently out of injections     Subjective: Pt presents for an OV for weight loss counseling Had been doing well on the [facility] and tolerated up to 1 mg weekly injections of Wegovy (again).  Reports she is having less side effects this time around and would like to continue taper.   Today she weighs 159 >160> 153.2      03/09/2023   11:20 AM 11/02/2022   10:27 AM 11/26/2021    9:38 AM 07/22/2020    1:34 PM 02/04/2020   10:09 AM  Depression screen PHQ 2/9  Decreased Interest 1 0 0 0 0  Down, Depressed, Hopeless 0 1 1 0 0  PHQ - 2 Score 1 1 1  0 0  Altered sleeping 2  3    Tired, decreased energy 1  1    Change in appetite 1  1    Feeling bad or failure about yourself  0  1    Trouble concentrating 0  1    Moving slowly or fidgety/restless 0  0    Suicidal thoughts 0  0    PHQ-9 Score 5  8    Difficult doing work/chores Not difficult at all        Allergies  Allergen Reactions   Codeine Nausea Only   Vicodin [Hydrocodone-Acetaminophen] Nausea Only   Social History   Social History Narrative   Mar[place], [place] 3 adult children Michelle Ochoa, [place], [place]).   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    Takes daily vitamin   Wears seatbelt   exercises routinely.    Smoke detector in the home.    Feels safe in her relationships.    Past Medical History:  Diagnosis Date   Anemia    with  1st childbirth   Arthritis    Basal cell carcinoma    Chicken pox    Headache    History of shingles 07/2020   Insomnia    PONV (postoperative nausea and vomiting)    Tick bite 05/25/2016   Toe fracture, right 2012   rt great toe   UTI (lower urinary tract infection)    Past Surgical History:  Procedure Laterality Date   APPENDECTOMY  2005   CESAREAN SECTION  [phone]   LAPAROSCOPIC ABDOMINAL EXPLORATION     Family History  Problem Relation Age of Onset   Breast cancer Mother        91   Dementia Mother    Prostate cancer Father    Colon polyps Father    Diabetes Paternal Uncle    Heart disease Paternal Uncle    Early death Paternal Uncle    Depression Maternal Grandmother    Diabetes Maternal Grandfather    Diabetes Paternal Grandfather    Colon cancer Neg Hx    Rectal cancer Neg Hx    Stomach cancer Neg  Hx    Allergies as of 04/22/2023       Reactions   Codeine Nausea Only   Vicodin [hydrocodone-acetaminophen] Nausea Only        Medication List        Accurate as of April 22, 2023 10:35 AM. If you have any questions, ask your nurse or doctor.          buPROPion 150 MG 24 hr tablet Commonly known as: WELLBUTRIN XL Take 1 tablet (150 mg total) by mouth daily.   estradiol 0.05 MG/24HR patch Commonly known as: VIVELLE-DOT 1 patch 2 (two) times a week.   progesterone 100 MG capsule Commonly known as: PROMETRIUM progesterone micronized 100 mg capsule  TAKE 1 CAPSULE BY MOUTH EVERY DAY   triamcinolone cream 0.1 % Commonly known as: KENALOG Apply 1 Application topically 2 (two) times daily.   Vitamin D (Cholecalciferol) 25 MCG (1000 UT) Caps Take 1 capsule by mouth daily.   Wegovy 0.25 MG/0.5ML Soaj Generic drug: Semaglutide-Weight Management Inject 0.25 mg into the skin once a week.   Wegovy 0.5 MG/0.5ML Soaj Generic drug: Semaglutide-Weight Management Inject 0.5 mg into the skin once a week.   Wegovy 1 MG/0.5ML Soaj Generic drug:  Semaglutide-Weight Management Inject 1 mg into the skin once a week.        All past medical history, surgical history, allergies, family history, immunizations andmedications were updated in the EMR today and reviewed under the history and medication portions of their EMR.     ROS Negative, with the exception of above mentioned in HPI  Objective:  BP 105/69   Pulse 64   Temp 97.8 F (36.6 C)   Wt 153 lb 3.2 oz (69.5 kg)   SpO2 97%   BMI 24.73 kg/m  Body mass index is 24.73 kg/m. Physical Exam Vitals and nursing note reviewed.  Constitutional:      General: She is not in acute distress.    Appearance: Normal appearance. She is normal weight. She is not ill-appearing or toxic-appearing.  Eyes:     Extraocular Movements: Extraocular movements intact.     Conjunctiva/sclera: Conjunctivae normal.     Pupils: Pupils are equal, round, and reactive to light.  Musculoskeletal:     Right lower leg: No edema.     Left lower leg: No edema.  Skin:    General: Skin is warm.     Findings: No rash.  Neurological:     Mental Status: She is alert and oriented to person, place, and time. Mental status is at baseline.  Psychiatric:        Mood and Affect: Mood normal.        Behavior: Behavior normal.        Thought Content: Thought content normal.        Judgment: Judgment normal.     No results found. No results found. No results found for this or any previous visit (from the past 24 hour(s)).  Assessment/Plan: Michelle Ochoa is a 62 y.o. female present for OV for  Elevated hemoglobin A1c Mixed hyperlipidemia Overweight with body mass index (BMI) of 26 in adult/weight loss counseling [facility] taper. Patient has been counseled on exercise, calorie counting, weight loss and potential medications to help with weight loss  -Patient was provided with online resources for: Weekly net calorie calculator.  Applications for calorie counting.  Patient was advised to ensure  she is taking in adequate nutrition daily by meeting calorie goals. -Patient was educated on  dietary changes to not only lose weight but to eat healthy.  Patient was educated on glycemic index. -Patient was educated on exercise goal of 150 minutes a week (plus warm up and cool down) of cardiovascular exercise.   -Patient was encouraged to maintain adequate water consumption of at least 100 ounces a day, more if exercising/sweating. -Increase Wegovy to 1.7 weekly injection, and then continue taper to [facility] 2.4 mg weekly injection.  Both prescriptions were provided today including refills on the 2.4 mg injection.   -If patient finds she cannot tolerate the 2.4 mg injection, then we can call in refills on the 1.7 mg as long as insurance will continue to cover it at the lower maintenance dose. Anxiety She elected to keep Wellbutrin at the 150 mg XL daily Did not start buspar  Follow up in 8 weeks- if wegovy covered, if not covered then she will need followup in 5.5 mos of cmc  Reviewed expectations re: course of current medical issues. Discussed self-management of symptoms. Outlined signs and symptoms indicating need for more acute intervention. Patient verbalized understanding and all questions were answered. Patient received an After-Visit Summary.    No orders of the defined types were placed in this encounter.  No orders of the defined types were placed in this encounter.  Referral Orders  No referral(s) requested today     Note is dictated utilizing voice recognition software. Although note has been proof read prior to signing, occasional typographical errors still can be missed. If any questions arise, please do not hesitate to call for verification.   electronically signed by:  Felix Pacini, DO  [facility] - [place]

## 2023-05-27 ENCOUNTER — Other Ambulatory Visit (HOSPITAL_BASED_OUTPATIENT_CLINIC_OR_DEPARTMENT_OTHER): Payer: Self-pay

## 2023-05-27 ENCOUNTER — Telehealth: Payer: Self-pay

## 2023-05-27 MED ORDER — WEGOVY 1.7 MG/0.75ML ~~LOC~~ SOAJ
1.7000 mg | SUBCUTANEOUS | 0 refills | Status: AC
Start: 2023-05-27 — End: ?
  Filled 2023-05-27: qty 3, 28d supply, fill #0

## 2023-05-27 NOTE — Telephone Encounter (Signed)
Prescription Request  05/27/2023    Patient would like to continue on taking 1.7mg  of Wegovy, insurance issues with the 2.4mg   LOV: Visit date not found  What is the name of the medication or equipment?   WEGOVY 1.7 MG/0.75ML SOAJ    Have you contacted your pharmacy to request a refill? No   Which pharmacy would you like this sent to?  [id number] - [place], [place] - [address] [place] [address] Phone: [phone] Fax: [phone]  [facility] [address] [place] [address] Phone: [phone] Fax: [phone]    Patient notified that their request is being sent to the clinical staff for review and that they should receive a response within 2 business days.   Please advise at Mobile [phone] (mobile)

## 2023-05-27 NOTE — Telephone Encounter (Signed)
Please complete PA

## 2023-05-27 NOTE — Telephone Encounter (Signed)
Refill sent for 1.7 mg. Pt is aware that if it is not covered she will have to go up to the 2.4 mg. Pt states it should be covered since it is now considered a maintenance dose.

## 2023-05-30 ENCOUNTER — Telehealth: Payer: Self-pay

## 2023-05-30 ENCOUNTER — Other Ambulatory Visit (HOSPITAL_BASED_OUTPATIENT_CLINIC_OR_DEPARTMENT_OTHER): Payer: Self-pay

## 2023-05-30 ENCOUNTER — Other Ambulatory Visit (HOSPITAL_COMMUNITY): Payer: Self-pay

## 2023-05-30 NOTE — Telephone Encounter (Signed)
PA request has been Approved. New Encounter created for follow up. For additional info see Pharmacy Prior Auth telephone encounter from 05/30/23.

## 2023-05-30 NOTE — Telephone Encounter (Signed)
Pharmacy Patient Advocate Encounter   Received notification from Pt Calls Messages that prior authorization for [facility] 1.7MG /0.75ML auto-injectors  is required/requested.   Insurance verification completed.   The patient is insured through [employer] .   Per test claim: APPROVED from 04/30/23 to 05/29/24

## 2023-05-31 ENCOUNTER — Other Ambulatory Visit (HOSPITAL_BASED_OUTPATIENT_CLINIC_OR_DEPARTMENT_OTHER): Payer: Self-pay

## 2023-06-01 ENCOUNTER — Other Ambulatory Visit: Payer: Self-pay

## 2023-06-01 DIAGNOSIS — Z1231 Encounter for screening mammogram for malignant neoplasm of breast: Secondary | ICD-10-CM | POA: Diagnosis not present

## 2023-06-01 DIAGNOSIS — Z01419 Encounter for gynecological examination (general) (routine) without abnormal findings: Secondary | ICD-10-CM | POA: Diagnosis not present

## 2023-06-01 DIAGNOSIS — Z6824 Body mass index (BMI) 24.0-24.9, adult: Secondary | ICD-10-CM | POA: Diagnosis not present

## 2023-06-02 ENCOUNTER — Other Ambulatory Visit (HOSPITAL_BASED_OUTPATIENT_CLINIC_OR_DEPARTMENT_OTHER): Payer: Self-pay

## 2023-06-02 ENCOUNTER — Other Ambulatory Visit: Payer: Self-pay

## 2023-06-02 ENCOUNTER — Encounter (HOSPITAL_BASED_OUTPATIENT_CLINIC_OR_DEPARTMENT_OTHER): Payer: Self-pay

## 2023-07-18 ENCOUNTER — Other Ambulatory Visit: Payer: Self-pay | Admitting: Family Medicine

## 2023-07-18 ENCOUNTER — Other Ambulatory Visit (HOSPITAL_BASED_OUTPATIENT_CLINIC_OR_DEPARTMENT_OTHER): Payer: Self-pay

## 2023-07-19 ENCOUNTER — Other Ambulatory Visit (HOSPITAL_BASED_OUTPATIENT_CLINIC_OR_DEPARTMENT_OTHER): Payer: Self-pay

## 2023-07-19 MED ORDER — WEGOVY 1.7 MG/0.75ML ~~LOC~~ SOAJ
1.7000 mg | SUBCUTANEOUS | 0 refills | Status: DC
  Filled 2023-07-19: qty 3, 28d supply, fill #0

## 2023-10-20 ENCOUNTER — Encounter: Payer: Self-pay | Admitting: Family Medicine

## 2023-10-20 ENCOUNTER — Ambulatory Visit: Payer: BC Managed Care – PPO | Admitting: Family Medicine

## 2023-10-20 ENCOUNTER — Telehealth: Payer: Self-pay

## 2023-10-20 VITALS — BP 118/80 | HR 70 | Temp 98.0°F | Wt 157.2 lb

## 2023-10-20 DIAGNOSIS — H8111 Benign paroxysmal vertigo, right ear: Secondary | ICD-10-CM | POA: Insufficient documentation

## 2023-10-20 DIAGNOSIS — E663 Overweight: Secondary | ICD-10-CM

## 2023-10-20 DIAGNOSIS — R42 Dizziness and giddiness: Secondary | ICD-10-CM

## 2023-10-20 DIAGNOSIS — H9203 Otalgia, bilateral: Secondary | ICD-10-CM | POA: Diagnosis not present

## 2023-10-20 DIAGNOSIS — F419 Anxiety disorder, unspecified: Secondary | ICD-10-CM | POA: Diagnosis not present

## 2023-10-20 DIAGNOSIS — Z713 Dietary counseling and surveillance: Secondary | ICD-10-CM

## 2023-10-20 MED ORDER — ZEPBOUND 2.5 MG/0.5ML ~~LOC~~ SOAJ: 2.5000 mg | SUBCUTANEOUS | 0 refills | Status: DC

## 2023-10-20 MED ORDER — DIAZEPAM 5 MG PO TABS: 2.5000 mg | ORAL_TABLET | Freq: Two times a day (BID) | ORAL | 1 refills | Status: AC | PRN

## 2023-10-20 MED ORDER — MECLIZINE HCL 25 MG PO TABS: 12.2500 mg | ORAL_TABLET | Freq: Three times a day (TID) | ORAL | 0 refills | Status: AC | PRN

## 2023-10-20 NOTE — Progress Notes (Signed)
 Michelle Ochoa , 05-02-1961, 63 y.o., female MRN: [id number] Patient Care Team    Relationship Specialty Notifications Start End  Catherine Charlies LABOR, DO PCP - General Family Medicine  05/20/16   Mat Browning, MD Consulting Physician Obstetrics and Gynecology  05/20/16   Legrand Victory LITTIE DOUGLAS, MD Consulting Physician Gastroenterology  02/04/20   Claudene Arthea HERO, DO Consulting Physician Sports Medicine  02/04/20   Buck Saucer, MD Attending Physician Neurology  02/04/20     Chief Complaint  Patient presents with   Ear Fullness    Last few weeks     Subjective: Michelle Ochoa is a 63 y.o. Pt presents for an OV with complaints of bilateral ear fullness of 3-4 weeks duration.  She reports at months that she had no other associated symptoms, including respiratory symptoms.  However the last couple days she has noticed dizziness, especially with turning her head right.  She has had vertigo in the past, and recalled she was dehydrated at the time, therefore she has been increasing her hydration.  She states with the dizziness does, nausea.  She had performed vestibular rehab in the past.   She also mentions chronic conditions today.  She states she stopped the Wegovy  altogether because she had 2 GI symptoms from 1.7 and 2.4 mg weekly injections.  She wished she could have stayed on the Wegovy  1 mg weekly injection, but her insurance did not approve that as a maintenance dose.   She reports her daughter started Zepbound  it is tolerating.  She is wondering if her insurance would cover Zepbound  start.  Patient's BMI today is 25.37.  Patient also mentions anxiety and sleep related issues since she is caring for her chronically ill parents.  We have prescribed Valium  2.5-5 mg twice daily as needed for her in the past (11/2021) and she has restarted these to help her with sleep.  She is wondering if she can get a new prescription, since these are outdated. She has been prescribed BuSpar  by this provider,  but never started.     03/09/2023   11:20 AM 11/02/2022   10:27 AM 11/26/2021    9:38 AM 07/22/2020    1:34 PM 02/04/2020   10:09 AM  Depression screen PHQ 2/9  Decreased Interest 1 0 0 0 0  Down, Depressed, Hopeless 0 1 1 0 0  PHQ - 2 Score 1 1 1  0 0  Altered sleeping 2  3    Tired, decreased energy 1  1    Change in appetite 1  1    Feeling bad or failure about yourself  0  1    Trouble concentrating 0  1    Moving slowly or fidgety/restless 0  0    Suicidal thoughts 0  0    PHQ-9 Score 5  8    Difficult doing work/chores Not difficult at all        Allergies  Allergen Reactions   Codeine Nausea Only   Vicodin [Hydrocodone-Acetaminophen ] Nausea Only   Social History   Social History Narrative   Married, Nurse, [place]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    Takes daily vitamin   Wears seatbelt   exercises routinely.    Smoke detector in the home.    Feels safe in her relationships.    Past Medical History:  Diagnosis Date   Anemia    with 1st  childbirth   Arthritis    Basal cell carcinoma    Chicken pox    Headache    History of shingles 07/2020   Insomnia    PONV (postoperative nausea and vomiting)    Tick bite 05/25/2016   Toe fracture, right 2012   rt great toe   UTI (lower urinary tract infection)    Past Surgical History:  Procedure Laterality Date   APPENDECTOMY  2005   CESAREAN SECTION  [phone]   LAPAROSCOPIC ABDOMINAL EXPLORATION     Family History  Problem Relation Age of Onset   Breast cancer Mother        46   Dementia Mother    Prostate cancer Father    Colon polyps Father    Diabetes Paternal Uncle    Heart disease Paternal Uncle    Early death Paternal Uncle    Depression Maternal Grandmother    Diabetes Maternal Grandfather    Diabetes Paternal Grandfather    Colon cancer Neg Hx    Rectal cancer Neg Hx    Stomach cancer Neg Hx    Allergies as of  10/20/2023       Reactions   Codeine Nausea Only   Vicodin [hydrocodone-acetaminophen ] Nausea Only        Medication List        Accurate as of October 20, 2023 11:14 AM. If you have any questions, ask your nurse or doctor.          STOP taking these medications    Wegovy  1.7 MG/0.75ML Soaj Generic drug: Semaglutide -Weight Management Stopped by: Charlies Bellini   Wegovy  2.4 MG/0.75ML Soaj Generic drug: Semaglutide -Weight Management Stopped by: Charlies Bellini       TAKE these medications    buPROPion  150 MG 24 hr tablet Commonly known as: WELLBUTRIN  XL Take 1 tablet (150 mg total) by mouth daily.   diazepam  5 MG tablet Commonly known as: VALIUM  Take 0.5-1 tablets (2.5-5 mg total) by mouth every 12 (twelve) hours as needed for anxiety. Started by: Kaiea Esselman   estradiol 0.05 MG/24HR patch Commonly known as: VIVELLE-DOT 1 patch 2 (two) times a week.   meclizine  25 MG tablet Commonly known as: ANTIVERT  Take 0.5-1 tablets (12.5-25 mg total) by mouth 3 (three) times daily as needed for dizziness. Started by: Charlies Bellini   progesterone 100 MG capsule Commonly known as: PROMETRIUM progesterone micronized 100 mg capsule  TAKE 1 CAPSULE BY MOUTH EVERY DAY   triamcinolone  cream 0.1 % Commonly known as: KENALOG  Apply 1 Application topically 2 (two) times daily.   Vitamin D  (Cholecalciferol) 25 MCG (1000 UT) Caps Take 1 capsule by mouth daily.   Zepbound  2.5 MG/0.5ML Pen Generic drug: tirzepatide  Inject 2.5 mg into the skin once a week. Started by: Charlies Bellini        All past medical history, surgical history, allergies, family history, immunizations andmedications were updated in the EMR today and reviewed under the history and medication portions of their EMR.     Review of Systems  Constitutional:  Negative for chills, fever and weight loss.  HENT:  Positive for ear pain. Negative for congestion, sinus pain and sore throat.   Gastrointestinal:  Negative  for nausea.  Skin:  Negative for rash.  Neurological:  Positive for dizziness. Negative for headaches.   Negative, with the exception of above mentioned in HPI   Objective:  BP 118/80   Pulse 70   Temp 98 F (36.7 C)   Wt 157 lb  3.2 oz (71.3 kg)   SpO2 97%   BMI 25.37 kg/m  Body mass index is 25.37 kg/m. Physical Exam Vitals and nursing note reviewed.  Constitutional:      General: She is not in acute distress.    Appearance: Normal appearance. She is normal weight. She is not ill-appearing or toxic-appearing.  HENT:     Head: Normocephalic and atraumatic.     Right Ear: Ear canal and external ear normal. There is no impacted cerumen.     Left Ear: Tympanic membrane, ear canal and external ear normal. There is no impacted cerumen.     Ears:     Comments: Very small effusion present, no erythema of membrane    Nose: No congestion or rhinorrhea.  Eyes:     General: No scleral icterus.       Right eye: No discharge.        Left eye: No discharge.     Extraocular Movements: Extraocular movements intact.     Conjunctiva/sclera: Conjunctivae normal.     Pupils: Pupils are equal, round, and reactive to light.  Musculoskeletal:     Cervical back: Neck supple.  Skin:    Findings: No rash.  Neurological:     Mental Status: She is alert and oriented to person, place, and time. Mental status is at baseline.     Motor: No weakness.     Coordination: Coordination normal.     Gait: Gait normal.  Psychiatric:        Mood and Affect: Mood normal.        Behavior: Behavior normal.        Thought Content: Thought content normal.        Judgment: Judgment normal.     No results found. No results found. No results found for this or any previous visit (from the past 24 hours).  Assessment/Plan: Michelle Ochoa is a 63 y.o. female present for OV for  Ear pain, bilateral/Dizziness/Benign paroxysmal positional vertigo of right ear (Primary) Patient is currently experiencing  BPPV-right.  Exam essentially normal, with only mild effusion right. Start Flonase nasal spray 1 spray each nostril daily 2-4 weeks. Meclizine  12.5-25 mg 3 times daily as needed, patient was cautioned on possible sedation side effects. If not improving, or worsening over the next 2 weeks could consider referral to vestibular rehab which she has performed in the past and it was helpful. Hydrate  Anxiety Agreed to refill diazepam  prescription for her.  She has gone through barely 30 tabs in 2 years.  Difficulty sleeping and increased anxiety. Continue diazepam  2.5-5 mg twice daily as needed as needed  Overweight (BMI 25.0-29.9)/hyperlipidemia Agreed to try will call in Zepbound  for her.  If it does not go through her pharmacy, she understands it is unlikely this will be approved since her BMI is just barely above normal.  She does fall into the overweight category with a comorbidity of hyperlipidemia. Start Zepbound  2.5 mg weekly injection.  Would not perform a prior authorization if patient does not meet criteria with BMI.  If approved only, will call in 5 mg next dosing and patient will need to follow-up in 6 weeks for any further prescriptions  Reviewed expectations re: course of current medical issues. Discussed self-management of symptoms. Outlined signs and symptoms indicating need for more acute intervention. Patient verbalized understanding and all questions were answered. Patient received an After-Visit Summary.    No orders of the defined types were placed in this encounter.  Meds ordered this encounter  Medications   meclizine  (ANTIVERT ) 25 MG tablet    Sig: Take 0.5-1 tablets (12.5-25 mg total) by mouth 3 (three) times daily as needed for dizziness.    Dispense:  90 tablet    Refill:  0   diazepam  (VALIUM ) 5 MG tablet    Sig: Take 0.5-1 tablets (2.5-5 mg total) by mouth every 12 (twelve) hours as needed for anxiety.    Dispense:  60 tablet    Refill:  1   ZEPBOUND  2.5  MG/0.5ML Pen    Sig: Inject 2.5 mg into the skin once a week.    Dispense:  2 mL    Refill:  0   Referral Orders  No referral(s) requested today     Note is dictated utilizing voice recognition software. Although note has been proof read prior to signing, occasional typographical errors still can be missed. If any questions arise, please do not hesitate to call for verification.   electronically signed by:  Charlies Bellini, DO  [facility] - [place]

## 2023-10-20 NOTE — Patient Instructions (Addendum)

## 2023-10-26 ENCOUNTER — Telehealth: Payer: Self-pay

## 2023-10-27 ENCOUNTER — Telehealth: Payer: Self-pay

## 2023-10-27 NOTE — Telephone Encounter (Signed)
 PA needed for zepbound

## 2023-10-27 NOTE — Telephone Encounter (Signed)
 Copied from CRM [id number]. Topic: Clinical - Prescription Issue >> Oct 27, 2023  9:34 AM Steele Sizer wrote: Reason for CRM: The pt stated that she is needing an prior authorization for medication Zepbound.

## 2023-10-28 ENCOUNTER — Telehealth: Payer: Self-pay

## 2023-10-28 ENCOUNTER — Other Ambulatory Visit (HOSPITAL_COMMUNITY): Payer: Self-pay

## 2023-10-28 NOTE — Telephone Encounter (Signed)
*  Primary  Pharmacy Patient Advocate Encounter   Received notification from CoverMyMeds that prior authorization for Zepbound  2.5MG /0.5ML pen-injectors  is required/requested.   Insurance verification completed.   The patient is insured through [employer] .   Per test claim: PA required; PA submitted to above mentioned insurance via CoverMyMeds Key/confirmation #/EOC [id number] Status is pending

## 2023-11-01 NOTE — Telephone Encounter (Signed)
 Pharmacy Patient Advocate Encounter  Received notification from [employer] that Prior Authorization for Zepbound  2.5mg /0.78ml has been DENIED.  Full denial letter will be uploaded to the media tab. See denial reason below.   PA #/Case ID/Reference #: Coverage is provided when at baseline, the patient had a body mass index of greater than or equal to 30 kilograms per meter squared (30 kg/m2); or, when at baseline, the patient had a body mass index greater than or equal to 27 kg/m2 and the patient had, or patient currently has, at least one of the following weight-related comorbidities: hypertension, type 2 diabetes, dyslipidemia, obstructive sleep apnea, cardiovascular disease, knee osteoarthritis, asthma, chronic obstructive pulmonary disease, metabolic-dysfunction associated steatotic liver  disease/ non-alcoholic fatty liver disease, polycystic ovarian syndrome, or coronary artery disease. This refers to baseline prior to any glucagon-like peptide-1 (GLP-1) agonist (for example, Saxenda, Wegovy ) or GLP-1/ glucose-dependent insulinotropic polypeptide (GIP) receptor agonist (for example, Zepbound ). Coverage cannot be authorized at this time.

## 2023-11-10 ENCOUNTER — Other Ambulatory Visit (HOSPITAL_BASED_OUTPATIENT_CLINIC_OR_DEPARTMENT_OTHER): Payer: Self-pay

## 2023-11-10 ENCOUNTER — Other Ambulatory Visit: Payer: Self-pay | Admitting: Family Medicine

## 2023-11-14 ENCOUNTER — Other Ambulatory Visit (HOSPITAL_BASED_OUTPATIENT_CLINIC_OR_DEPARTMENT_OTHER): Payer: Self-pay

## 2023-11-15 ENCOUNTER — Other Ambulatory Visit (HOSPITAL_BASED_OUTPATIENT_CLINIC_OR_DEPARTMENT_OTHER): Payer: Self-pay

## 2023-11-15 DIAGNOSIS — H40013 Open angle with borderline findings, low risk, bilateral: Secondary | ICD-10-CM | POA: Diagnosis not present

## 2023-12-29 DIAGNOSIS — C44319 Basal cell carcinoma of skin of other parts of face: Secondary | ICD-10-CM | POA: Diagnosis not present

## 2023-12-29 DIAGNOSIS — D485 Neoplasm of uncertain behavior of skin: Secondary | ICD-10-CM | POA: Diagnosis not present

## 2023-12-29 DIAGNOSIS — D225 Melanocytic nevi of trunk: Secondary | ICD-10-CM | POA: Diagnosis not present

## 2023-12-29 DIAGNOSIS — Z85828 Personal history of other malignant neoplasm of skin: Secondary | ICD-10-CM | POA: Diagnosis not present

## 2023-12-29 DIAGNOSIS — L821 Other seborrheic keratosis: Secondary | ICD-10-CM | POA: Diagnosis not present

## 2023-12-29 DIAGNOSIS — L814 Other melanin hyperpigmentation: Secondary | ICD-10-CM | POA: Diagnosis not present

## 2024-01-31 DIAGNOSIS — C44319 Basal cell carcinoma of skin of other parts of face: Secondary | ICD-10-CM | POA: Diagnosis not present

## 2024-04-25 ENCOUNTER — Other Ambulatory Visit: Payer: Self-pay

## 2024-04-25 MED ORDER — BUPROPION HCL ER (XL) 150 MG PO TB24: 150.0000 mg | ORAL_TABLET | Freq: Every day | ORAL | 0 refills | Status: DC

## 2024-05-03 ENCOUNTER — Ambulatory Visit: Payer: Self-pay | Admitting: *Deleted

## 2024-05-03 NOTE — Telephone Encounter (Signed)
 FYI Only or Action Required?: Action required by provider: referral request.  Patient was last seen in primary care on 10/20/2023 by Michelle Ochoa, [name], DO.  Called Nurse Triage reporting ear pressure.  Symptoms began several months ago.  Interventions attempted: OTC medications: antihistamine .  Symptoms are: pressure better- but new symptom.  Triage Disposition: See PCP When Office is Open (Within 3 Days)  Patient/caregiver understands and will follow disposition?: No, wishes to speak with PCPPatient is requesting referral ENT for ear pressure and clicking- no open appointment within patient disposition- message sent to provider.  Reason for Disposition  [1] Ear congestion lasts > 3 days AND [2] no improvement after using Care Advice  (Exception: Ear congestion is a chronic symptom.)  Answer Assessment - Initial Assessment Questions 1. LOCATION: Which ear is involved?       Left ear 2. SENSATION: Describe how the ear feels. (Ochoa.g., stuffy, full, plugged).      Clicking in left ear 3. ONSET:  When did the ear symptoms start?       6 months - started with pressure- now having clicking. Pressure is better 4. PAIN: Do you also have an earache? If Yes, ask: How bad is it? (Scale 0-10; none, mild, moderate or severe)     Slight pressure- both ears- more in left 5. CAUSE: What do you think is causing the ear congestion? (Ochoa.g., common cold, nasal allergies, recent flight, recent snorkeling)     Unsure- possible fluid in ear 6. OTHER SYMPTOMS: Do you have any other symptoms? (Ochoa.g., ear drainage, hay fever symptoms such as sneezing or a clear nasal discharge; cold symptoms such as a cough or runny nose)     Slight pop/fluid-in left- unsure cause  Protocols used: Ear - Congestion-A-AH    Copied from CRM [id number]. Topic: Clinical - Red Word Triage >> May 03, 2024  2:53 PM Michelle Ochoa wrote: [facility] that prompted transfer to Nurse Triage: Patient having inner ear issues, feels  a clicking in her left ear and then pressure build up in both ears. Going on for about 6 months, but stated it could have been longer.

## 2024-05-03 NOTE — Telephone Encounter (Signed)
 I have multiple appointments open on Monday.   I see that there was a request for referral since I could not get her in sooner, ENT would not get her in for a few weeks to months, even if I would place the referral.  If she does not want to schedule with this provider on Monday for next week, then she can be scheduled with another [place] provider to receive treatment sooner.

## 2024-05-03 NOTE — Telephone Encounter (Signed)
 Please advise if new appt needed for referral request

## 2024-05-04 NOTE — Telephone Encounter (Signed)
Pt scheduled for 7/22

## 2024-05-07 ENCOUNTER — Ambulatory Visit: Payer: Self-pay

## 2024-05-07 NOTE — Telephone Encounter (Signed)
 Pt has appt scheduled.

## 2024-05-07 NOTE — Telephone Encounter (Signed)
 FYI Only or Action Required?: FYI only for provider.  Patient was last seen in primary care on 10/20/2023 by Catherine Fuller A, DO.  Called Nurse Triage reporting reschedule appt and Rash.  Symptoms began yesterday.  Interventions attempted: Nothing.  Symptoms are: unchanged.  Triage Disposition: Home Care  Patient/caregiver understands and will follow disposition?: Yes   Copied from CRM [id number]. Topic: Clinical - Red Word Triage >> May 07, 2024  9:02 AM Mesmerise C wrote: [facility] that prompted transfer to Nurse Triage: Patient stated she has a bug bite advised it's read and purplish as well Reason for Disposition  Mild localized rash  Answer Assessment - Initial Assessment Questions 1. APPEARANCE of RASH: What does the rash look like? (e.g., blisters, dry flaky skin, red spots, redness, sores)     Maybe bug bite  2. LOCATION: Where is the rash located?      Abdomen 3. NUMBER: How many spots are there?      Small 4. SIZE: How big are the spots? (e.g., inches, cm; or compare to size of pinhead, tip of pen, eraser, pea)      1.5 inches 5. ONSET: When did the rash start?      yesterday 6. ITCHING: Does the rash itch? If Yes, ask: How bad is the itch?  (Scale 0-10; or none, mild, moderate, severe)     No 7. PAIN: Does the rash hurt? If Yes, ask: How bad is the pain?  (Scale 0-10; or none, mild, moderate, severe)     Denies 8. OTHER SYMPTOMS: Do you have any other symptoms? (e.g., fever) Ear pressure  Additional info: Patient has OV 05/08/24 with PCP to evaluate ear pressure, she called today hoping to reschedule appointment to today due to other commitment. Added to wait list for today.  Patient Mentioned new rash or bug bite, unsure, triaged for new symptoms.  Protocols used: Rash or Redness - Localized-A-AH

## 2024-05-08 ENCOUNTER — Ambulatory Visit: Admitting: Family Medicine

## 2024-05-08 NOTE — Progress Notes (Deleted)
 Michelle Ochoa , Mar 23, 1961, 63 y.o., female MRN: [id number] Patient Care Team    Relationship Specialty Notifications Start End  Catherine Charlies LABOR, DO PCP - General Family Medicine  05/20/16   Mat Browning, MD Consulting Physician Obstetrics and Gynecology  05/20/16   Legrand Victory LITTIE DOUGLAS, MD Consulting Physician Gastroenterology  02/04/20   Claudene Arthea HERO, DO Consulting Physician Sports Medicine  02/04/20   Buck Saucer, MD Attending Physician Neurology  02/04/20     No chief complaint on file.    Subjective: Michelle Ochoa is a 63 y.o. Pt presents for an OV with complaints of otalgia of *** duration.  Associated symptoms include ***.  Pt has tried *** to ease their symptoms.      10/20/2023    2:40 PM 03/09/2023   11:20 AM 11/02/2022   10:27 AM 11/26/2021    9:38 AM 07/22/2020    1:34 PM  Depression screen PHQ 2/9  Decreased Interest 0 1 0 0 0  Down, Depressed, Hopeless 0 0 1 1 0  PHQ - 2 Score 0 1 1 1  0  Altered sleeping  2  3   Tired, decreased energy  1  1   Change in appetite  1  1   Feeling bad or failure about yourself   0  1   Trouble concentrating  0  1   Moving slowly or fidgety/restless  0  0   Suicidal thoughts  0  0   PHQ-9 Score  5  8   Difficult doing work/chores  Not difficult at all       Allergies  Allergen Reactions   Codeine Nausea Only   Vicodin [Hydrocodone-Acetaminophen ] Nausea Only   Social History   Social History Narrative   Married, Nurse, [place]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    Takes daily vitamin   Wears seatbelt   exercises routinely.    Smoke detector in the home.    Feels safe in her relationships.    Past Medical History:  Diagnosis Date   Anemia    with 1st childbirth   Arthritis    Basal cell carcinoma    Chicken pox    Headache    History of shingles 07/2020   Insomnia    PONV (postoperative nausea and vomiting)    Tick bite 05/25/2016    Toe fracture, right 2012   rt great toe   UTI (lower urinary tract infection)    Past Surgical History:  Procedure Laterality Date   APPENDECTOMY  2005   CESAREAN SECTION  [phone]   LAPAROSCOPIC ABDOMINAL EXPLORATION     Family History  Problem Relation Age of Onset   Breast cancer Mother        33   Dementia Mother    Prostate cancer Father    Colon polyps Father    Diabetes Paternal Uncle    Heart disease Paternal Uncle    Early death Paternal Uncle    Depression Maternal Grandmother    Diabetes Maternal Grandfather    Diabetes Paternal Grandfather    Colon cancer Neg Hx    Rectal cancer Neg Hx    Stomach cancer Neg Hx    Allergies as of 05/08/2024       Reactions   Codeine Nausea Only   Vicodin [hydrocodone-acetaminophen ] Nausea Only        Medication List  Accurate as of May 08, 2024  8:15 AM. If you have any questions, ask your nurse or doctor.          buPROPion  150 MG 24 hr tablet Commonly known as: WELLBUTRIN  XL Take 1 tablet (150 mg total) by mouth daily.   diazepam  5 MG tablet Commonly known as: VALIUM  Take 0.5-1 tablets (2.5-5 mg total) by mouth every 12 (twelve) hours as needed for anxiety.   estradiol 0.05 MG/24HR patch Commonly known as: VIVELLE-DOT 1 patch 2 (two) times a week.   meclizine  25 MG tablet Commonly known as: ANTIVERT  Take 0.5-1 tablets (12.5-25 mg total) by mouth 3 (three) times daily as needed for dizziness.   progesterone 100 MG capsule Commonly known as: PROMETRIUM progesterone micronized 100 mg capsule  TAKE 1 CAPSULE BY MOUTH EVERY DAY   triamcinolone  cream 0.1 % Commonly known as: KENALOG  Apply 1 Application topically 2 (two) times daily.   Vitamin D  (Cholecalciferol) 25 MCG (1000 UT) Caps Take 1 capsule by mouth daily.   Zepbound  2.5 MG/0.5ML Pen Generic drug: tirzepatide  Inject 2.5 mg into the skin once a week.        All past medical history, surgical history, allergies, family  history, immunizations andmedications were updated in the EMR today and reviewed under the history and medication portions of their EMR.     ROS Negative, with the exception of above mentioned in HPI   Objective:  There were no vitals taken for this visit. There is no height or weight on file to calculate BMI.  Physical Exam   No results found. No results found. No results found for this or any previous visit (from the past 24 hours).  Assessment/Plan: Michelle Ochoa is a 63 y.o. female present for OV for  *** Reviewed expectations re: course of current medical issues. Discussed self-management of symptoms. Outlined signs and symptoms indicating need for more acute intervention. Patient verbalized understanding and all questions were answered. Patient received an After-Visit Summary.    No orders of the defined types were placed in this encounter.  No orders of the defined types were placed in this encounter.  Referral Orders  No referral(s) requested today     Note is dictated utilizing voice recognition software. Although note has been proof read prior to signing, occasional typographical errors still can be missed. If any questions arise, please do not hesitate to call for verification.   electronically signed by:  Charlies Bellini, DO  [facility] [place]

## 2024-05-09 ENCOUNTER — Encounter: Payer: Self-pay | Admitting: Family Medicine

## 2024-05-09 ENCOUNTER — Ambulatory Visit: Admitting: Family Medicine

## 2024-05-09 VITALS — BP 100/68 | HR 56 | Temp 97.7°F | Wt 161.0 lb

## 2024-05-09 DIAGNOSIS — H6992 Unspecified Eustachian tube disorder, left ear: Secondary | ICD-10-CM | POA: Insufficient documentation

## 2024-05-09 DIAGNOSIS — G479 Sleep disorder, unspecified: Secondary | ICD-10-CM | POA: Insufficient documentation

## 2024-05-09 DIAGNOSIS — R21 Rash and other nonspecific skin eruption: Secondary | ICD-10-CM

## 2024-05-09 HISTORY — DX: Unspecified Eustachian tube disorder, left ear: H69.92

## 2024-05-09 MED ORDER — TRAZODONE HCL 50 MG PO TABS: 25.0000 mg | ORAL_TABLET | Freq: Every evening | ORAL | 5 refills | Status: DC | PRN

## 2024-05-09 NOTE — Patient Instructions (Signed)
 Flonase nasal spray each side of nose daily.    Eustachian Tube Dysfunction  Eustachian tube dysfunction refers to a condition in which a blockage develops in the narrow passage that connects the middle ear to the back of the nose (eustachian tube). The eustachian tube regulates air pressure in the middle ear by letting air move between the ear and nose. It also helps to drain fluid from the middle ear space. Eustachian tube dysfunction can affect one or both ears. When the eustachian tube does not function properly, air pressure, fluid, or both can build up in the middle ear. What are the causes? This condition occurs when the eustachian tube becomes blocked or cannot open normally. Common causes of this condition include: Ear infections. Colds and other infections that affect the nose, mouth, and throat (upper respiratory tract). Allergies. Irritation from cigarette smoke. Irritation from stomach acid coming up into the esophagus (gastroesophageal reflux). The esophagus is the part of the body that moves food from the mouth to the stomach. Sudden changes in air pressure, such as from descending in an airplane or scuba diving. Abnormal growths in the nose or throat, such as: Growths that line the nose (nasal polyps). Abnormal growth of cells (tumors). Enlarged tissue at the back of the throat (adenoids). What increases the risk? You are more likely to develop this condition if: You smoke. You are overweight. You are a child who has: Certain birth defects of the mouth, such as cleft palate. Large tonsils or adenoids. What are the signs or symptoms? Common symptoms of this condition include: A feeling of fullness in the ear. Ear pain. Clicking or popping noises in the ear. Ringing in the ear (tinnitus). Hearing loss. Loss of balance. Dizziness. Symptoms may get worse when the air pressure around you changes, such as when you travel to an area of high elevation, fly on an airplane,  or go scuba diving. How is this diagnosed? This condition may be diagnosed based on: Your symptoms. A physical exam of your ears, nose, and throat. Tests, such as those that measure: The movement of your eardrum. Your hearing (audiometry). How is this treated? Treatment depends on the cause and severity of your condition. In mild cases, you may relieve your symptoms by moving air into your ears. This is called popping the ears. In more severe cases, or if you have symptoms of fluid in your ears, treatment may include: Medicines to relieve congestion (decongestants). Medicines that treat allergies (antihistamines). Nasal sprays or ear drops that contain medicines that reduce swelling (steroids). A procedure to drain the fluid in your eardrum. In this procedure, a small tube may be placed in the eardrum to: Drain the fluid. Restore the air in the middle ear space. A procedure to insert a balloon device through the nose to inflate the opening of the eustachian tube (balloon dilation). Follow these instructions at home: Lifestyle Do not do any of the following until your health care provider approves: Travel to high altitudes. Fly in airplanes. Work in a Estate agent or room. Scuba dive. Do not use any products that contain nicotine or tobacco. These products include cigarettes, chewing tobacco, and vaping devices, such as e-cigarettes. If you need help quitting, ask your health care provider. Keep your ears dry. Wear fitted earplugs during showering and bathing. Dry your ears completely after. General instructions Take over-the-counter and prescription medicines only as told by your health care provider. Use techniques to help pop your ears as recommended by your health  care provider. These may include: Chewing gum. Yawning. Frequent, forceful swallowing. Closing your mouth, holding your nose closed, and gently blowing as if you are trying to blow air out of your nose. Keep all  follow-up visits. This is important. Contact a health care provider if: Your symptoms do not go away after treatment. Your symptoms come back after treatment. You are unable to pop your ears. You have: A fever. Pain in your ear. Pain in your head or neck. Fluid draining from your ear. Your hearing suddenly changes. You become very dizzy. You lose your balance. Get help right away if: You have a sudden, severe increase in any of your symptoms. Summary Eustachian tube dysfunction refers to a condition in which a blockage develops in the eustachian tube. It can be caused by ear infections, allergies, inhaled irritants, or abnormal growths in the nose or throat. Symptoms may include ear pain or fullness, hearing loss, or ringing in the ears. Mild cases are treated with techniques to unblock the ears, such as yawning or chewing gum. More severe cases are treated with medicines or procedures. This information is not intended to replace advice given to you by your health care provider. Make sure you discuss any questions you have with your health care provider. Document Revised: 12/15/2020 Document Reviewed: 12/15/2020 Elsevier Patient Education  2024 [employer].

## 2024-05-09 NOTE — Progress Notes (Signed)
 Michelle Ochoa , Sep 13, 1961, 63 y.o., female MRN: [id number] Patient Care Team    Relationship Specialty Notifications Start End  Catherine Charlies LABOR, DO PCP - General Family Medicine  05/20/16   Mat Browning, MD Consulting Physician Obstetrics and Gynecology  05/20/16   Legrand Victory LITTIE DOUGLAS, MD Consulting Physician Gastroenterology  02/04/20   Claudene Arthea HERO, DO Consulting Physician Sports Medicine  02/04/20   Buck Saucer, MD Attending Physician Neurology  02/04/20     Chief Complaint  Patient presents with   Ear Pain    Left ear has clicking noise with pressure; rash on llq     Subjective: Michelle Ochoa is a 63 y.o. Pt presents for an OV with complaints of left ear pain of few weeks duration.  Associated symptoms include clicking noise in her ear with pressure. Rash at injection site of zepbound  through remedy meds.  She denies fevers, chills, congestion or sinus pressure.  She also complains of sleep disturbance.  She has a lot of concerns surrounding different types of medication and dementia/blood-brain barrier secondary to her parents dementia status.  She does want to discuss if there is a medication that she can possibly try.     10/20/2023    2:40 PM 03/09/2023   11:20 AM 11/02/2022   10:27 AM 11/26/2021    9:38 AM 07/22/2020    1:34 PM  Depression screen PHQ 2/9  Decreased Interest 0 1 0 0 0  Down, Depressed, Hopeless 0 0 1 1 0  PHQ - 2 Score 0 1 1 1  0  Altered sleeping  2  3   Tired, decreased energy  1  1   Change in appetite  1  1   Feeling bad or failure about yourself   0  1   Trouble concentrating  0  1   Moving slowly or fidgety/restless  0  0   Suicidal thoughts  0  0   PHQ-9 Score  5  8   Difficult doing work/chores  Not difficult at all       Allergies  Allergen Reactions   Codeine Nausea Only   Vicodin [Hydrocodone-Acetaminophen ] Nausea Only   Social History   Social History Narrative   [place] 3 adult children [place],  [place], [place]).   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    Takes daily vitamin   Wears seatbelt   exercises routinely.    Smoke detector in the home.    Feels safe in her relationships.    Past Medical History:  Diagnosis Date   Anemia    with 1st childbirth   Arthritis    Basal cell carcinoma    Chicken pox    Headache    History of shingles 07/2020   Insomnia    PONV (postoperative nausea and vomiting)    Tick bite 05/25/2016   Toe fracture, right 2012   rt great toe   UTI (lower urinary tract infection)    Past Surgical History:  Procedure Laterality Date   APPENDECTOMY  2005   CESAREAN SECTION  [phone]   LAPAROSCOPIC ABDOMINAL EXPLORATION     Family History  Problem Relation Age of Onset   Breast cancer Mother        71   Dementia Mother    Prostate cancer Father    Colon polyps Father    Diabetes Paternal Uncle    Heart disease Paternal Uncle  Early death Paternal Uncle    Depression Maternal Grandmother    Diabetes Maternal Grandfather    Diabetes Paternal Grandfather    Colon cancer Neg Hx    Rectal cancer Neg Hx    Stomach cancer Neg Hx    Allergies as of 05/09/2024       Reactions   Codeine Nausea Only   Vicodin [hydrocodone-acetaminophen ] Nausea Only        Medication List        Accurate as of May 09, 2024 12:04 PM. If you have any questions, ask your nurse or doctor.          buPROPion  150 MG 24 hr tablet Commonly known as: WELLBUTRIN  XL Take 1 tablet (150 mg total) by mouth daily.   diazepam  5 MG tablet Commonly known as: VALIUM  Take 0.5-1 tablets (2.5-5 mg total) by mouth every 12 (twelve) hours as needed for anxiety.   estradiol 0.05 MG/24HR patch Commonly known as: VIVELLE-DOT 1 patch 2 (two) times a week.   meclizine  25 MG tablet Commonly known as: ANTIVERT  Take 0.5-1 tablets (12.5-25 mg total) by mouth 3 (three) times daily as needed for dizziness.   progesterone 100 MG  capsule Commonly known as: PROMETRIUM progesterone micronized 100 mg capsule  TAKE 1 CAPSULE BY MOUTH EVERY DAY   traZODone  50 MG tablet Commonly known as: DESYREL  Take 0.5-1 tablets (25-50 mg total) by mouth at bedtime as needed for sleep. Started by: Bernardo Brayman   triamcinolone  cream 0.1 % Commonly known as: KENALOG  Apply 1 Application topically 2 (two) times daily.   Vitamin D  (Cholecalciferol) 25 MCG (1000 UT) Caps Take 1 capsule by mouth daily.   Zepbound  2.5 MG/0.5ML Pen Generic drug: tirzepatide  Inject 2.5 mg into the skin once a week.        All past medical history, surgical history, allergies, family history, immunizations andmedications were updated in the EMR today and reviewed under the history and medication portions of their EMR.     ROS Negative, with the exception of above mentioned in HPI   Objective:  BP 100/68   Pulse (!) 56   Temp 97.7 F (36.5 C)   Wt 161 lb (73 kg)   SpO2 98%   BMI 25.99 kg/m  Body mass index is 25.99 kg/m. Physical Exam Vitals and nursing note reviewed.  Constitutional:      General: She is not in acute distress.    Appearance: Normal appearance. She is normal weight. She is not ill-appearing or toxic-appearing.  HENT:     Head: Normocephalic and atraumatic.     Right Ear: Ear canal and external ear normal. No tenderness. A middle ear effusion is present. Tympanic membrane is not injected, erythematous, retracted or bulging.     Left Ear: Ear canal and external ear normal. No tenderness. A middle ear effusion is present. Tympanic membrane is not injected, erythematous, retracted or bulging.     Nose: No congestion or rhinorrhea.     Mouth/Throat:     Mouth: Mucous membranes are moist.     Pharynx: No oropharyngeal exudate or posterior oropharyngeal erythema.  Eyes:     General: No scleral icterus.       Right eye: No discharge.        Left eye: No discharge.     Extraocular Movements: Extraocular movements intact.      Conjunctiva/sclera: Conjunctivae normal.     Pupils: Pupils are equal, round, and reactive to light.  Musculoskeletal:  Cervical back: Neck supple.  Skin:    Findings: No rash.  Neurological:     Mental Status: She is alert and oriented to person, place, and time. Mental status is at baseline.     Motor: No weakness.     Coordination: Coordination normal.     Gait: Gait normal.  Psychiatric:        Mood and Affect: Mood normal.        Behavior: Behavior normal.        Thought Content: Thought content normal.        Judgment: Judgment normal.      No results found. No results found. No results found for this or any previous visit (from the past 24 hours).  Assessment/Plan: Michelle Ochoa is a 63 y.o. female present for OV for  Eustachian tube dysfunction, left (Primary) Otc antihistamine and flonase nasal spray recommended.   Rash Injection site reaction from her Zepbound .  Encouraged her to rotate areas of injection weekly Hydrocortisone prn  Sleep disturbance: Discussed different options. Attempting to avoid hypnotics.  Trazodone  25-50 mg at bedtime prn ordered.   Reviewed expectations re: course of current medical issues. Discussed self-management of symptoms. Outlined signs and symptoms indicating need for more acute intervention. Patient verbalized understanding and all questions were answered. Patient received an After-Visit Summary.    No orders of the defined types were placed in this encounter.  Meds ordered this encounter  Medications   traZODone  (DESYREL ) 50 MG tablet    Sig: Take 0.5-1 tablets (25-50 mg total) by mouth at bedtime as needed for sleep.    Dispense:  30 tablet    Refill:  5   Referral Orders  No referral(s) requested today     Note is dictated utilizing voice recognition software. Although note has been proof read prior to signing, occasional typographical errors still can be missed. If any questions arise, please do not  hesitate to call for verification.   electronically signed by:  Charlies Bellini, DO  [facility] - [place]

## 2024-05-21 ENCOUNTER — Other Ambulatory Visit: Payer: Self-pay | Admitting: Obstetrics and Gynecology

## 2024-05-21 ENCOUNTER — Other Ambulatory Visit: Payer: Self-pay | Admitting: Family Medicine

## 2024-05-21 DIAGNOSIS — Z1231 Encounter for screening mammogram for malignant neoplasm of breast: Secondary | ICD-10-CM

## 2024-05-21 NOTE — Telephone Encounter (Unsigned)
 Copied from CRM [id number]. Topic: Clinical - Medication Refill >> May 21, 2024 10:55 AM Shereese L wrote: Medication: progesterone (PROMETRIUM) 100 MG capsule  Has the patient contacted their pharmacy? Yes (Agent: If no, request that the patient contact the pharmacy for the refill. If patient does not wish to contact the pharmacy document the reason why and proceed with request.) (Agent: If yes, when and what did the pharmacy advise?)  This is the patient's preferred pharmacy:  [id number] - [place], [place] - [address] [place] [address] Phone: [phone] Fax: [phone]  Is this the correct pharmacy for this prescription? Yes If no, delete pharmacy and type the correct one.   Has the prescription been filled recently? Yes  Is the patient out of the medication? Yes  Has the patient been seen for an appointment in the last year OR does the patient have an upcoming appointment? Yes  Can we respond through MyChart? Yes  Agent: Please be advised that Rx refills may take up to 3 business days. We ask that you follow-up with your pharmacy.

## 2024-05-25 ENCOUNTER — Other Ambulatory Visit: Payer: Self-pay | Admitting: Family Medicine

## 2024-05-25 NOTE — Telephone Encounter (Signed)
 Copied from CRM [id number]. Topic: Clinical - Medication Refill >> May 25, 2024 12:15 PM Leah C wrote: Medication: buPROPion  (WELLBUTRIN  XL) 150 MG 24 hr tablet  Has the patient contacted their pharmacy? [facility] called and stated they have been trying to get in touch with Dr. Catherine to refill the script for the patient.    This is the patient's preferred pharmacy:  [id number] - [place], [place] - [address] [place] [address] Phone: [phone] Fax: [phone]  Is this the correct pharmacy for this prescription? Yes If no, delete pharmacy and type the correct one.   Has the prescription been filled recently? No  Is the patient out of the medication? Yes  Has the patient been seen for an appointment in the last year OR does the patient have an upcoming appointment? Yes  Can we respond through MyChart? Yes  Agent: Please be advised that Rx refills may take up to 3 business days. We ask that you follow-up with your pharmacy.

## 2024-05-28 ENCOUNTER — Other Ambulatory Visit: Payer: Self-pay

## 2024-05-28 MED ORDER — BUPROPION HCL ER (XL) 150 MG PO TB24: 150.0000 mg | ORAL_TABLET | Freq: Every day | ORAL | 0 refills | Status: DC

## 2024-05-28 NOTE — Telephone Encounter (Signed)
 Pt called back and stated that she just saw pcp on 7/23 and would lie the refill to be for 90 days. She asked for a call back.

## 2024-05-29 ENCOUNTER — Telehealth: Payer: Self-pay

## 2024-05-29 DIAGNOSIS — H6990 Unspecified Eustachian tube disorder, unspecified ear: Secondary | ICD-10-CM

## 2024-05-29 NOTE — Telephone Encounter (Signed)
 Did the patient discuss referral with their provider in the last year? Yes  (If No - schedule appointment)  (If Yes - send message) Appointment offered? No  Type of order/referral and detailed reason for visit: ENT been experiencing water in ear  Preference of office, provider, location: No preference just looking to get into somewhere soon  If referral order, have you been seen by this specialty before? No  (If Yes, this issue or another issue? When? Where?  Can we respond through MyChart? No

## 2024-05-29 NOTE — Telephone Encounter (Signed)
 Pt aware and verbalized understanding.

## 2024-05-29 NOTE — Addendum Note (Signed)
 Addended by: GEORGEAN BEEN A on: 05/29/2024 02:31 PM   Modules accepted: Orders

## 2024-05-29 NOTE — Telephone Encounter (Signed)
 Referral to ENT can be placed for eustachian tube dysfunction for patient if she desires.  It will take a while to get in to an ENT. Make sure she is taking her antihistamine and Flonase nasal spray daily.  As that is the first step in treatment of eustachian tube dysfunction.

## 2024-05-30 DIAGNOSIS — H40013 Open angle with borderline findings, low risk, bilateral: Secondary | ICD-10-CM | POA: Diagnosis not present

## 2024-06-06 ENCOUNTER — Ambulatory Visit
Admission: RE | Admit: 2024-06-06 | Discharge: 2024-06-06 | Disposition: A | Discharge status: Home / Self Care | Source: Ambulatory Visit | Attending: Obstetrics and Gynecology | Admitting: Obstetrics and Gynecology

## 2024-06-06 DIAGNOSIS — Z1231 Encounter for screening mammogram for malignant neoplasm of breast: Secondary | ICD-10-CM

## 2024-06-11 DIAGNOSIS — Z124 Encounter for screening for malignant neoplasm of cervix: Secondary | ICD-10-CM | POA: Diagnosis not present

## 2024-06-11 DIAGNOSIS — Z6825 Body mass index (BMI) 25.0-25.9, adult: Secondary | ICD-10-CM | POA: Diagnosis not present

## 2024-06-11 DIAGNOSIS — Z01419 Encounter for gynecological examination (general) (routine) without abnormal findings: Secondary | ICD-10-CM | POA: Diagnosis not present

## 2024-06-11 LAB — HM PAP SMEAR

## 2024-06-11 LAB — RESULTS CONSOLE HPV: CHL HPV: NEGATIVE

## 2024-06-25 ENCOUNTER — Ambulatory Visit (INDEPENDENT_AMBULATORY_CARE_PROVIDER_SITE_OTHER): Admitting: Physician Assistant

## 2024-06-25 ENCOUNTER — Encounter (INDEPENDENT_AMBULATORY_CARE_PROVIDER_SITE_OTHER): Payer: Self-pay | Admitting: Physician Assistant

## 2024-06-25 VITALS — BP 117/69 | HR 60 | Ht 65.0 in | Wt 155.0 lb

## 2024-06-25 DIAGNOSIS — H9393 Unspecified disorder of ear, bilateral: Secondary | ICD-10-CM

## 2024-06-25 DIAGNOSIS — H938X3 Other specified disorders of ear, bilateral: Secondary | ICD-10-CM

## 2024-06-25 NOTE — Patient Instructions (Signed)
 Aureliano med sinus rinse daily, followed by Flonase. Add daily antihistamine if symptoms do not improve.

## 2024-06-25 NOTE — Progress Notes (Signed)
 Dear Dr. Catherine, Here is my assessment for our mutual patient, Michelle Ochoa. Thank you for allowing me the opportunity to care for your patient. Please do not hesitate to contact me should you have any other questions. Sincerely, Chyrl Cohen PA-C  Otolaryngology Clinic Note Referring provider: Dr. Catherine HPI:  Michelle Ochoa is a 63 y.o. female kindly referred by Dr. Catherine   The patient is a 63 year old female presenting today for evaluation of ear fullness.  The patient notes that for the last 1.5 years she has had intermittent sensation of pressure in her bilateral ears.  She notes that this does fluctuate from 1 year to the other.  She notes it has been intermittent but fairly consistent.  Denies any significant pain, she does note some clicking in her left ear predominantly at night.  She denies any history recurrent infections as an adult, she denies any recurrent infections as a kid but did have ear infection as a kid.  She notes that she saw her primary care provider who noted middle ear effusion on the left, she was started on Flonase and daily antihistamine.  She notes she has inconsistently used the [employer].  She denies any significant seasonal allergies.  No changes to her hearing.  She notes a history of intermittent vertigo.   Independent Review of Additional Tests or Records:  PCP note 05/09/2024   PMH/Meds/All/SocHx/FamHx/ROS:   Past Medical History:  Diagnosis Date   Anemia    with 1st childbirth   Arthritis    Basal cell carcinoma    Chicken pox    Headache    History of shingles 07/2020   Insomnia    PONV (postoperative nausea and vomiting)    Tick bite 05/25/2016   Toe fracture, right 2012   rt great toe   UTI (lower urinary tract infection)      Past Surgical History:  Procedure Laterality Date   APPENDECTOMY  2005   CESAREAN SECTION  [phone]   LAPAROSCOPIC ABDOMINAL EXPLORATION      Family History  Problem Relation Age of Onset   Breast cancer  Mother        18   Dementia Mother    Prostate cancer Father    Colon polyps Father    Diabetes Paternal Uncle    Heart disease Paternal Uncle    Early death Paternal Uncle    Depression Maternal Grandmother    Diabetes Maternal Grandfather    Diabetes Paternal Grandfather    Colon cancer Neg Hx    Rectal cancer Neg Hx    Stomach cancer Neg Hx      Social Connections: Not on file      Current Outpatient Medications:    buPROPion  (WELLBUTRIN  XL) 150 MG 24 hr tablet, Take 1 tablet (150 mg total) by mouth daily., Disp: 90 tablet, Rfl: 0   diazepam  (VALIUM ) 5 MG tablet, Take 0.5-1 tablets (2.5-5 mg total) by mouth every 12 (twelve) hours as needed for anxiety., Disp: 60 tablet, Rfl: 1   estradiol (VIVELLE-DOT) 0.05 MG/24HR patch, 1 patch 2 (two) times a week., Disp: , Rfl:    meclizine  (ANTIVERT ) 25 MG tablet, Take 0.5-1 tablets (12.5-25 mg total) by mouth 3 (three) times daily as needed for dizziness., Disp: 90 tablet, Rfl: 0   progesterone (PROMETRIUM) 100 MG capsule, progesterone micronized 100 mg capsule  TAKE 1 CAPSULE BY MOUTH EVERY DAY, Disp: , Rfl:    traZODone  (DESYREL ) 50 MG tablet, Take 0.5-1 tablets (25-50 mg total) by  mouth at bedtime as needed for sleep., Disp: 30 tablet, Rfl: 5   triamcinolone  cream (KENALOG ) 0.1 %, Apply 1 Application topically 2 (two) times daily., Disp: 45 g, Rfl: 0   Vitamin D , Cholecalciferol, 25 MCG (1000 UT) CAPS, Take 1 capsule by mouth daily., Disp: , Rfl:    ZEPBOUND  2.5 MG/0.5ML Pen, Inject 2.5 mg into the skin once a week., Disp: 2 mL, Rfl: 0   Physical Exam:   BP 117/69   Pulse 60   Ht 5' 5 (1.651 m)   Wt 155 lb (70.3 kg)   SpO2 98%   BMI 25.79 kg/m   Pertinent Findings  CN II-XII intact Bilateral EAC clear and TM intact with well pneumatized middle ear spaces Anterior rhinoscopy: Septum midline; bilateral inferior turbinates with no hypertrophy No lesions of oral cavity/oropharynx; dentition wnl No obviously palpable neck  masses/lymphadenopathy/thyromegaly No respiratory distress or stridor  Seprately Identifiable Procedures:  None  Impression & Plans:  [name] is a 63 y.o. female with the following   Ear fullness-  Patient presents today with bilateral ear fullness.  I do have suspicion for eustachian tube dysfunction.  She was noted to have left middle ear effusion by her primary care provider.  On exam today the effusion has resolved with normal exam.  I would recommend audiological evaluation, once these results are available I will discuss them with the patient.  I would also recommend she initiate daily saline irrigation, followed by Flonase, and daily antihistamine.  If her symptoms despite longer than 3 months of daily use of these medications she may discontinue using them.  She notes that this is not significantly bothersome in her life.  We will discuss the audiological results and follow-up plan moving forward.   - f/u phone call discussion after audiological results   Thank you for allowing me the opportunity to care for your patient. Please do not hesitate to contact me should you have any other questions.  Sincerely, Chyrl Cohen PA-C [facility] Phone: [phone] Fax: [phone]  06/25/2024, 1:10 PM

## 2024-06-26 DIAGNOSIS — Z13820 Encounter for screening for osteoporosis: Secondary | ICD-10-CM | POA: Diagnosis not present

## 2024-07-11 ENCOUNTER — Telehealth: Payer: Self-pay

## 2024-07-11 NOTE — Telephone Encounter (Signed)
 Reason for CRM: Patient's insurance is changing from [employer] to [employer]. She was informed that Michelle Ochoa is contracted but TAX ID and address they have for her does not match what we have in the system and that can result in billing issues . Patient would like to avoid any billing concerns and would like to verify if the tax ID can be updated or how she should handle this.   Spoke to patient regarding Tax ID, gave her billing to contact if she had further concerns regarding this issue. Closed CRM

## 2024-07-12 DIAGNOSIS — L700 Acne vulgaris: Secondary | ICD-10-CM | POA: Diagnosis not present

## 2024-08-14 ENCOUNTER — Ambulatory Visit (INDEPENDENT_AMBULATORY_CARE_PROVIDER_SITE_OTHER): Admitting: Audiology

## 2024-08-29 ENCOUNTER — Encounter: Payer: Self-pay | Admitting: Family Medicine

## 2024-08-29 ENCOUNTER — Ambulatory Visit: Admitting: Family Medicine

## 2024-08-29 VITALS — BP 112/70 | HR 69 | Temp 97.9°F | Wt 163.0 lb

## 2024-08-29 DIAGNOSIS — R7309 Other abnormal glucose: Secondary | ICD-10-CM

## 2024-08-29 DIAGNOSIS — E782 Mixed hyperlipidemia: Secondary | ICD-10-CM

## 2024-08-29 DIAGNOSIS — Z713 Dietary counseling and surveillance: Secondary | ICD-10-CM

## 2024-08-29 LAB — POCT GLYCOSYLATED HEMOGLOBIN (HGB A1C)
HbA1c POC (<> result, manual entry): 5.1 % (ref 4.0–5.6)
HbA1c, POC (controlled diabetic range): 5.1 % (ref 0.0–7.0)
HbA1c, POC (prediabetic range): 5.1 % — AB (ref 5.7–6.4)
Hemoglobin A1C: 5.1 % (ref 4.0–5.6)

## 2024-08-29 MED ORDER — ZEPBOUND 2.5 MG/0.5ML ~~LOC~~ SOLN: 2.5000 mg | SUBCUTANEOUS | 1 refills | Status: DC

## 2024-08-29 NOTE — Progress Notes (Signed)
 Michelle Ochoa , Feb 14, 1961, 63 y.o., female MRN: [id number] Patient Care Team    Relationship Specialty Notifications Start End  Catherine Charlies LABOR, DO PCP - General Family Medicine  05/20/16   Mat Browning, MD Consulting Physician Obstetrics and Gynecology  05/20/16   Legrand Victory LITTIE DOUGLAS, MD Consulting Physician Gastroenterology  02/04/20   Claudene Arthea HERO, DO Consulting Physician Sports Medicine  02/04/20   Buck Saucer, MD Attending Physician Neurology  02/04/20     Chief Complaint  Patient presents with   Medication Management    Pt stopped taking compound Zepbound  1 month ago. Pt wants to discuss plain Zepbound  at a low dose now that she has new insurance.      Subjective: Michelle Ochoa is a 63 y.o. Pt presents for an OV with to discuss weight loss counseling. Patient has had weight loss counseling in the past and was tried on Wegovy  which caused her a fair amount of side effects. Her BMI today is 27.12, weight 163 pounds. She does exercise by walking routinely. She has made dietary modifications attempting low glycemic diet. She has a family history of Alzheimer's and is interested in the research surrounding the use of GLP-1's and its benefits in preventing Alzheimer's.     10/20/2023    2:40 PM 03/09/2023   11:20 AM 11/02/2022   10:27 AM 11/26/2021    9:38 AM 07/22/2020    1:34 PM  Depression screen PHQ 2/9  Decreased Interest 0 1 0 0 0  Down, Depressed, Hopeless 0 0 1 1 0  PHQ - 2 Score 0 1 1 1  0  Altered sleeping  2  3   Tired, decreased energy  1  1   Change in appetite  1  1   Feeling bad or failure about yourself   0  1   Trouble concentrating  0  1   Moving slowly or fidgety/restless  0  0   Suicidal thoughts  0  0   PHQ-9 Score  5   8    Difficult doing work/chores  Not difficult at all        Data saved with a previous flowsheet row definition    Allergies  Allergen Reactions   Codeine Nausea Only   Vicodin [Hydrocodone-Acetaminophen ] Nausea Only    Social History   Social History Narrative   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    Takes daily vitamin   Wears seatbelt   exercises routinely.    Smoke detector in the home.    Feels safe in her relationships.    Past Medical History:  Diagnosis Date   Anemia    with 1st childbirth   Arthritis    Basal cell carcinoma    Chicken pox    Headache    History of shingles 07/2020   Insomnia    PONV (postoperative nausea and vomiting)    Tick bite 05/25/2016   Toe fracture, right 2012   rt great toe   UTI (lower urinary tract infection)    Past Surgical History:  Procedure Laterality Date   APPENDECTOMY  2005   CESAREAN SECTION  [phone]   LAPAROSCOPIC ABDOMINAL EXPLORATION     Family History  Problem Relation Age of Onset   Breast cancer Mother        50   Dementia Mother    Prostate cancer  Father    Colon polyps Father    Diabetes Paternal Uncle    Heart disease Paternal Uncle    Early death Paternal Uncle    Depression Maternal Grandmother    Diabetes Maternal Grandfather    Diabetes Paternal Grandfather    Colon cancer Neg Hx    Rectal cancer Neg Hx    Stomach cancer Neg Hx    Allergies as of 08/29/2024       Reactions   Codeine Nausea Only   Vicodin [hydrocodone-acetaminophen ] Nausea Only        Medication List        Accurate as of August 29, 2024 11:10 AM. If you have any questions, ask your nurse or doctor.          STOP taking these medications    Zepbound  2.5 MG/0.5ML Pen Generic drug: tirzepatide  Replaced by: Zepbound  2.5 MG/0.5ML injection vial Stopped by: Charlies Bellini       TAKE these medications    buPROPion  150 MG 24 hr tablet Commonly known as: WELLBUTRIN  XL Take 1 tablet (150 mg total) by mouth daily.   diazepam  5 MG tablet Commonly known as: VALIUM  Take 0.5-1 tablets (2.5-5 mg total) by mouth every 12 (twelve)  hours as needed for anxiety.   estradiol 0.05 MG/24HR patch Commonly known as: VIVELLE-DOT 1 patch 2 (two) times a week.   meclizine  25 MG tablet Commonly known as: ANTIVERT  Take 0.5-1 tablets (12.5-25 mg total) by mouth 3 (three) times daily as needed for dizziness.   progesterone 100 MG capsule Commonly known as: PROMETRIUM progesterone micronized 100 mg capsule  TAKE 1 CAPSULE BY MOUTH EVERY DAY   traZODone  50 MG tablet Commonly known as: DESYREL  Take 0.5-1 tablets (25-50 mg total) by mouth at bedtime as needed for sleep.   triamcinolone  cream 0.1 % Commonly known as: KENALOG  Apply 1 Application topically 2 (two) times daily.   Vitamin D  (Cholecalciferol) 25 MCG (1000 UT) Caps Take 1 capsule by mouth daily.   Zepbound  2.5 MG/0.5ML injection vial Generic drug: tirzepatide  Inject 2.5 mg into the skin once a week. Replaces: Zepbound  2.5 MG/0.5ML Pen Started by: Charlies Bellini        All past medical history, surgical history, allergies, family history, immunizations andmedications were updated in the EMR today and reviewed under the history and medication portions of their EMR.     ROS Negative, with the exception of above mentioned in HPI   Objective:  BP 112/70   Pulse 69   Temp 97.9 F (36.6 C)   Wt 163 lb (73.9 kg)   SpO2 97%   BMI 27.12 kg/m  Body mass index is 27.12 kg/m. Physical Exam Vitals and nursing note reviewed.  Constitutional:      General: She is not in acute distress.    Appearance: Normal appearance. She is not ill-appearing, toxic-appearing or diaphoretic.  HENT:     Head: Normocephalic and atraumatic.  Eyes:     General: No scleral icterus.       Right eye: No discharge.        Left eye: No discharge.     Extraocular Movements: Extraocular movements intact.     Conjunctiva/sclera: Conjunctivae normal.     Pupils: Pupils are equal, round, and reactive to light.  Cardiovascular:     Rate and Rhythm: Normal rate and regular rhythm.      Heart sounds: No murmur heard. Musculoskeletal:     Cervical back: Neck supple.  Right lower leg: No edema.     Left lower leg: No edema.  Skin:    General: Skin is warm.     Findings: No rash.  Neurological:     Mental Status: She is alert and oriented to person, place, and time. Mental status is at baseline.     Motor: No weakness.     Gait: Gait normal.  Psychiatric:        Mood and Affect: Mood normal.        Behavior: Behavior normal.        Thought Content: Thought content normal.        Judgment: Judgment normal.     No results found. No results found. Results for orders placed or performed in visit on 08/29/24 (from the past 24 hours)  POCT HgB A1C     Status: Abnormal   Collection Time: 08/29/24 10:36 AM  Result Value Ref Range   Hemoglobin A1C 5.1 4.0 - 5.6 %   HbA1c POC (<> result, manual entry) 5.1 4.0 - 5.6 %   HbA1c, POC (prediabetic range) 5.1 (A) 5.7 - 6.4 %   HbA1c, POC (controlled diabetic range) 5.1 0.0 - 7.0 %    Assessment/Plan: ZAIA CARRE is a 63 y.o. female present for OV for  Weight loss counseling, encounter for (Primary) Patient denies any history of medullary thyroid  cancer in herself or her family, no family history of neuroendocrine tumors. - Patient was counseled on exercise, calorie counting, weight loss and potential medications to help with weight loss today. -Patient was provided with online resources for: Weekly net calorie calculator.  Applications for calorie counting.  Patient was advised to ensure she is taking in adequate nutrition daily by meeting calorie goals. -Patient was educated on dietary changes to not only lose weight but to eat healthy.  Patient was educated on glycemic index. -Patient was educated on exercise goal of 150 minutes a week (plus warm up and cool down) of cardiovascular exercise.  Patient was educated on heart rate for cardiovascular and fat burning zones. -Patient was encouraged to maintain adequate water  consumption of at least 80 ounces a day, more if exercising/sweating. Start Zepbound  2.5 mg weekly injection-refill x 1 (patient preference)> [facility]  -Patient understands any refills or medication changes requested require a face-to-face appointment. Follow-up in 6-8 weeks  Mixed hyperlipidemia/Elevated hemoglobin A1c A1c has been elevated, A1c today 5.1.  Return in about 7 weeks (around 10/15/2024).   Reviewed expectations re: course of current medical issues. Discussed self-management of symptoms. Outlined signs and symptoms indicating need for more acute intervention. Patient verbalized understanding and all questions were answered. Patient received an After-Visit Summary.    Orders Placed This Encounter  Procedures   HM PAP SMEAR   POCT HgB A1C   Meds ordered this encounter  Medications   tirzepatide  (ZEPBOUND ) 2.5 MG/0.5ML injection vial    Sig: Inject 2.5 mg into the skin once a week.    Dispense:  2 mL    Refill:  1   Referral Orders  No referral(s) requested today     Note is dictated utilizing voice recognition software. Although note has been proof read prior to signing, occasional typographical errors still can be missed. If any questions arise, please do not hesitate to call for verification.   electronically signed by:  Charlies Bellini, DO  [facility] - [place]

## 2024-08-29 NOTE — Patient Instructions (Addendum)
 [email or web address]> create account.   LOW glycemic foods only Water 80 ounces Exercise > 150 minutes a week of cardiovascular exercise  Calorie counting 1200 cal day  Return in about 7 weeks (around 10/15/2024).        Great to see you today.  I have refilled the medication(s) we provide.   If labs were collected or images ordered, we will inform you of  results once we have received them and reviewed. We will contact you either by echart message, or telephone call.  Please give ample time to the testing facility, and our office to run,  receive and review results. Please do not call inquiring of results, even if you can see them in your chart. We will contact you as soon as we are able. If it has been over 1 week since the test was completed, and you have not yet heard from us , then please call us .    - echart message- for normal results that have been seen by the patient already.   - telephone call: abnormal results or if patient has not viewed results in their echart.  If a referral to a specialist was entered for you, please call us  in 2 weeks if you have not heard from the specialist office to schedule.

## 2024-08-30 ENCOUNTER — Telehealth: Payer: Self-pay | Admitting: Family Medicine

## 2024-08-30 NOTE — Telephone Encounter (Signed)
 Copied from CRM #[id number]. Topic: Clinical - Medication Refill >> Aug 30, 2024  2:39 PM Shereese L wrote: Medication: buPROPion  (WELLBUTRIN  XL) 150 MG 24 hr tablet  Has the patient contacted their pharmacy? Yes (Agent: If no, request that the patient contact the pharmacy for the refill. If patient does not wish to contact the pharmacy document the reason why and proceed with request.) (Agent: If yes, when and what did the pharmacy advise?)  This is the patient's preferred pharmacy:  [id number] - [place], [place] - [address] [place] [address] Phone: [phone] Fax: [phone]   Is this the correct pharmacy for this prescription? Yes If no, delete pharmacy and type the correct one.   Has the prescription been filled recently? Yes  Is the patient out of the medication? Yes  Has the patient been seen for an appointment in the last year OR does the patient have an upcoming appointment? Yes  Can we respond through MyChart? Yes  Agent: Please be advised that Rx refills may take up to 3 business days. We ask that you follow-up with your pharmacy.

## 2024-09-04 MED ORDER — BUPROPION HCL ER (XL) 150 MG PO TB24: 150.0000 mg | ORAL_TABLET | Freq: Every day | ORAL | 0 refills | Status: DC

## 2024-09-04 NOTE — Telephone Encounter (Signed)
 Patient very upset they prescription was denied on 11/13 by our office but on 11/12 she was here to see Dr. Catherine for medication question  Please advise

## 2024-09-04 NOTE — Telephone Encounter (Signed)
Message routed to PCP.

## 2024-09-04 NOTE — Telephone Encounter (Signed)
 Patient reports appointment with scheduled for an acute appointment to discuss weight loss treatment and options for medications-her chronic conditions were never discussed.  Please explain to patient the difference between an acute and a chronic medication appointment.  She also wanted the appointment to be a preventative code, which is a Z code for the weight loss for insurance purposes.  A chronic condition appointment, in which we manage her medications and chronic conditions, is not a Z code.  I am willing to extend the prescription for her and have sent in a refill today, but we will need to follow-up for her chronic conditions to be managed within the next 90 days before requiring additional refill.

## 2024-09-04 NOTE — Addendum Note (Signed)
 Addended by: Jaciel Diem A on: 09/04/2024 02:18 PM   Modules accepted: Orders

## 2024-09-04 NOTE — Telephone Encounter (Signed)
 Spoke with patient regarding results/recommendations.

## 2024-09-04 NOTE — Telephone Encounter (Unsigned)
 Copied from CRM [id number]. Topic: Clinical - Prescription Issue >> Sep 04, 2024 11:00 AM Rea ORN wrote: Reason for CRM: Pt calling to see why her bupropion  has not been called in. I called [facility] and spoke with front desk. She stated she will send the request back to PCP because she believes it was denied in error. It was denied due to pt needing an appt but the pt was seen 08/29/24.   Pt is asking to be called back once rx is sent [phone]

## 2024-09-20 ENCOUNTER — Other Ambulatory Visit (INDEPENDENT_AMBULATORY_CARE_PROVIDER_SITE_OTHER): Payer: Self-pay | Admitting: Physician Assistant

## 2024-09-20 ENCOUNTER — Ambulatory Visit (INDEPENDENT_AMBULATORY_CARE_PROVIDER_SITE_OTHER): Admitting: Audiology

## 2024-09-20 DIAGNOSIS — H93293 Other abnormal auditory perceptions, bilateral: Secondary | ICD-10-CM

## 2024-09-20 NOTE — Progress Notes (Signed)
  [address], [address] [place], [place] [address] [phone]  Audiological Evaluation    Name: Michelle Ochoa     DOB:   Nov 20, 1960      MRN:   [id number]                                                                                     Service Date: 09/20/2024     Accompanied by: self    Patient comes today after Reyes Cohen, PA-C sent a referral for a hearing evaluation due to concerns with ear fullness.   Symptoms Yes Details  Hearing loss  []    Tinnitus  [x]  Whooshing , left ear sometimes  Ear pain/ infections/pressure  [x]  Fullness in both ears, popping, pressure.   Balance problems  []    Noise exposure history  []    Previous ear surgeries  []    Family history of hearing loss  []    Amplification  []    Other  []      Otoscopy: Right ear: Clear external ear canal and notable landmarks visualized on the tympanic membrane. Left ear:  Clear external ear canal and notable landmarks visualized on the tympanic membrane.  Tympanometry: Right ear: Type As - Normal external ear canal volume with normal middle ear pressure and low tympanic membrane compliance. Findings are consistent with reduced eardrum mobility. Left ear: Type As - Normal external ear canal volume with normal middle ear pressure and low tympanic membrane compliance. Findings are consistent with reduced eardrum mobility.    Hearing Evaluation The hearing test results were completed under headphones and results are deemed to be of good reliability. Test technique:  conventional    Pure tone Audiometry: Right ear- Normal hearing thresholds across 250 Hz-8000 Hz    Left ear-  Normal hearing thresholds across 250 Hz - 8000 Hz.  Speech Audiometry: Right ear- Speech Reception Threshold (SRT) was obtained at 5 dBHL. Left ear-Speech Reception Threshold (SRT) was obtained at 5 dBHL.   Word Recognition Score Tested using NU-6 (recorded) Right ear: 100% was obtained at a presentation level of 50 dBHL with  contralateral masking which is deemed as  excellent. Left ear: 96% was obtained at a presentation level of 50 dBHL with contralateral masking which is deemed as  excellent.   Impression: There is not a significant difference in pure-tone thresholds between ears. There is not a significant difference in the word recognition score in between ears.    Recommendations: Follow up with ENT as needed. Repeat audiogram with concerns of tinnitus or hearing changes, or as per MD.   ROSALINE EARNIE SARNA, AUD

## 2024-10-08 ENCOUNTER — Encounter: Payer: Self-pay | Admitting: Audiology

## 2024-10-08 ENCOUNTER — Telehealth (INDEPENDENT_AMBULATORY_CARE_PROVIDER_SITE_OTHER): Payer: Self-pay

## 2024-10-08 NOTE — Telephone Encounter (Signed)
 Called patient regarding hearing test results and explained results. Patient would like to be reevaluated by Chyrl Cohen due to ongoing symptoms. Per Chyrl Cohen this is okay. Please schedule patient to come in within the next 2-3 weeks.

## 2024-10-23 ENCOUNTER — Encounter: Payer: Self-pay | Admitting: Family Medicine

## 2024-10-23 ENCOUNTER — Ambulatory Visit: Admitting: Family Medicine

## 2024-10-23 VITALS — BP 108/74 | HR 68 | Temp 98.0°F | Wt 166.0 lb

## 2024-10-23 DIAGNOSIS — E663 Overweight: Secondary | ICD-10-CM

## 2024-10-23 DIAGNOSIS — F419 Anxiety disorder, unspecified: Secondary | ICD-10-CM | POA: Diagnosis not present

## 2024-10-23 DIAGNOSIS — R41840 Attention and concentration deficit: Secondary | ICD-10-CM | POA: Diagnosis not present

## 2024-10-23 DIAGNOSIS — Z713 Dietary counseling and surveillance: Secondary | ICD-10-CM | POA: Diagnosis not present

## 2024-10-23 MED ORDER — BUPROPION HCL ER (XL) 150 MG PO TB24: 150.0000 mg | ORAL_TABLET | Freq: Every day | ORAL | 1 refills | Status: AC

## 2024-10-23 NOTE — Progress Notes (Signed)
 "      Michelle Ochoa , [birth year], 64 y.o., female MRN: [id number] Patient Care Team    Relationship Specialty Notifications Start End  Catherine Charlies LABOR, DO PCP - General Family Medicine  05/20/16   Mat Browning, MD Consulting Physician Obstetrics and Gynecology  05/20/16   Legrand Victory LITTIE DOUGLAS, MD Consulting Physician Gastroenterology  02/04/20   Claudene Arthea HERO, DO Consulting Physician Sports Medicine  02/04/20   Buck Saucer, MD Attending Physician Neurology  02/04/20     Chief Complaint  Patient presents with   Weight Management Screening    7 week f/u.      Subjective: Michelle Ochoa is a 64 y.o. Pt presents for an OV with to discuss weight loss counseling. Patient has had weight loss counseling in the past and was tried on Wegovy  which caused her a fair amount of side effects. Her BMI today is 27.12, >27.62 weight 163 pounds.> 166 She does exercise by walking routinely. She has made dietary modifications attempting low glycemic diet. She has a family history of Alzheimer's and is interested in the research surrounding the use of GLP-1's and its benefits in preventing Alzheimer's. Last visit 08/29/2024 we had a discussion for GLP's and she took Zepbound  2.5 mg for 4 weeks and decided not to continue due to cost. She reports she has decided to wait till the Wegovy  pill comes out and pay for this out-of-pocket since she has read that it will cost less then injections.      10/23/2024   10:02 AM 10/20/2023    2:40 PM 03/09/2023   11:20 AM 11/02/2022   10:27 AM 11/26/2021    9:38 AM  Depression screen PHQ 2/9  Decreased Interest 0 0 1 0 0  Down, Depressed, Hopeless 0 0 0 1 1  PHQ - 2 Score 0 0 1 1 1   Altered sleeping 2  2  3   Tired, decreased energy 0  1  1  Change in appetite 0  1  1  Feeling bad or failure about yourself  0  0  1  Trouble concentrating 1  0  1  Moving slowly or fidgety/restless 0  0  0  Suicidal thoughts 0  0  0  PHQ-9 Score 3  5   8    Difficult doing  work/chores Not difficult at all  Not difficult at all       Data saved with a previous flowsheet row definition    Allergies  Allergen Reactions   Codeine Nausea Only   Vicodin [Hydrocodone-Acetaminophen ] Nausea Only   Social History   Social History Narrative   [place], [place] 3 adult children [place], [place], [place]).   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    Takes daily vitamin   Wears seatbelt   exercises routinely.    Smoke detector in the home.    Feels safe in her relationships.    Past Medical History:  Diagnosis Date   Anemia    with 1st childbirth   Arthritis    Basal cell carcinoma    Chicken pox    Eustachian tube dysfunction, left 05/09/2024   Headache    History of shingles 07/2020   Insomnia    PONV (postoperative nausea and vomiting)    Tick bite 05/25/2016   Toe fracture, right 2012   rt great toe   UTI (lower urinary tract infection)    Past Surgical History:  Procedure Laterality Date  APPENDECTOMY  2005   CESAREAN SECTION  [phone]   LAPAROSCOPIC ABDOMINAL EXPLORATION     Family History  Problem Relation Age of Onset   Breast cancer Mother        24   Dementia Mother    Prostate cancer Father    Colon polyps Father    Diabetes Paternal Uncle    Heart disease Paternal Uncle    Early death Paternal Uncle    Depression Maternal Grandmother    Diabetes Maternal Grandfather    Diabetes Paternal Grandfather    Colon cancer Neg Hx    Rectal cancer Neg Hx    Stomach cancer Neg Hx    Allergies as of 10/23/2024       Reactions   Codeine Nausea Only   Vicodin [hydrocodone-acetaminophen ] Nausea Only        Medication List        Accurate as of October 23, 2024 11:59 PM. If you have any questions, ask your nurse or doctor.          STOP taking these medications    traZODone  50 MG tablet Commonly known as: DESYREL  Stopped by: Charlies Bellini, DO   Zepbound  2.5 MG/0.5ML injection  vial Generic drug: tirzepatide  Stopped by: Charlies Bellini, DO       TAKE these medications    buPROPion  150 MG 24 hr tablet Commonly known as: WELLBUTRIN  XL Take 1 tablet (150 mg total) by mouth daily.   diazepam  5 MG tablet Commonly known as: VALIUM  Take 0.5-1 tablets (2.5-5 mg total) by mouth every 12 (twelve) hours as needed for anxiety.   estradiol 0.05 MG/24HR patch Commonly known as: VIVELLE-DOT 1 patch 2 (two) times a week.   meclizine  25 MG tablet Commonly known as: ANTIVERT  Take 0.5-1 tablets (12.5-25 mg total) by mouth 3 (three) times daily as needed for dizziness.   progesterone 100 MG capsule Commonly known as: PROMETRIUM progesterone micronized 100 mg capsule  TAKE 1 CAPSULE BY MOUTH EVERY DAY   triamcinolone  cream 0.1 % Commonly known as: KENALOG  Apply 1 Application topically 2 (two) times daily.   Vitamin D  (Cholecalciferol) 25 MCG (1000 UT) Caps Take 1 capsule by mouth daily.        All past medical history, surgical history, allergies, family history, immunizations andmedications were updated in the EMR today and reviewed under the history and medication portions of their EMR.     ROS Negative, with the exception of above mentioned in HPI   Objective:  BP 108/74   Pulse 68   Temp 98 F (36.7 C)   Wt 166 lb (75.3 kg)   SpO2 98%   BMI 27.62 kg/m  Body mass index is 27.62 kg/m. Physical Exam Vitals and nursing note reviewed.  Constitutional:      General: She is not in acute distress.    Appearance: Normal appearance. She is not ill-appearing, toxic-appearing or diaphoretic.  HENT:     Head: Normocephalic and atraumatic.  Eyes:     General: No scleral icterus.       Right eye: No discharge.        Left eye: No discharge.     Extraocular Movements: Extraocular movements intact.     Conjunctiva/sclera: Conjunctivae normal.     Pupils: Pupils are equal, round, and reactive to light.  Cardiovascular:     Rate and Rhythm: Normal rate and  regular rhythm.     Heart sounds: No murmur heard. Musculoskeletal:     Cervical back:  Neck supple.     Right lower leg: No edema.     Left lower leg: No edema.  Skin:    General: Skin is warm.     Findings: No rash.  Neurological:     Mental Status: She is alert and oriented to person, place, and time. Mental status is at baseline.     Motor: No weakness.     Gait: Gait normal.  Psychiatric:        Mood and Affect: Mood normal.        Behavior: Behavior normal.        Thought Content: Thought content normal.        Judgment: Judgment normal.     No results found. No results found. No results found for this or any previous visit (from the past 24 hours).   Assessment/Plan: KIWANNA SPRAKER is a 64 y.o. female present for OV for  Weight loss counseling, encounter for (Primary) Patient denies any history of medullary thyroid  cancer in herself or her family, no family history of neuroendocrine tumors. - Patient was counseled on exercise, calorie counting, weight loss and potential medications to help with weight loss today. -Patient was provided with online resources for: Weekly net calorie calculator.  Applications for calorie counting.  Patient was advised to ensure she is taking in adequate nutrition daily by meeting calorie goals. -Patient was educated on dietary changes to not only lose weight but to eat healthy.  Patient was educated on glycemic index. -Patient was educated on exercise goal of 150 minutes a week (plus warm up and cool down) of cardiovascular exercise.  Patient was educated on heart rate for cardiovascular and fat burning zones. -Patient was encouraged to maintain adequate water consumption of at least 80 ounces a day, more if exercising/sweating. Start Zepbound  2.5 mg weekly injection-refill x 1 (patient preference)> [facility]  -Patient understands any refills or medication changes requested require a face-to-face appointment. Follow-up to be  determined after information on Wegovy  pill prescription received and prescribed for patient.  She will be paying for the Wegovy  pill out-of-pocket and understands that insurance will not cover.    No follow-ups on file.   Reviewed expectations re: course of current medical issues. Discussed self-management of symptoms. Outlined signs and symptoms indicating need for more acute intervention. Patient verbalized understanding and all questions were answered. Patient received an After-Visit Summary.    No orders of the defined types were placed in this encounter.  Meds ordered this encounter  Medications   buPROPion  (WELLBUTRIN  XL) 150 MG 24 hr tablet    Sig: Take 1 tablet (150 mg total) by mouth daily.    Dispense:  90 tablet    Refill:  1   Referral Orders  No referral(s) requested today     Note is dictated utilizing voice recognition software. Although note has been proof read prior to signing, occasional typographical errors still can be missed. If any questions arise, please do not hesitate to call for verification.   electronically signed by:  Charlies Bellini, DO  [facility] [place]    "

## 2024-10-24 ENCOUNTER — Ambulatory Visit (INDEPENDENT_AMBULATORY_CARE_PROVIDER_SITE_OTHER): Admitting: Physician Assistant

## 2024-10-24 ENCOUNTER — Encounter (INDEPENDENT_AMBULATORY_CARE_PROVIDER_SITE_OTHER): Payer: Self-pay | Admitting: Physician Assistant

## 2024-10-24 VITALS — BP 102/65 | HR 56 | Temp 98.4°F

## 2024-10-24 DIAGNOSIS — H6993 Unspecified Eustachian tube disorder, bilateral: Secondary | ICD-10-CM

## 2024-10-24 DIAGNOSIS — M26603 Bilateral temporomandibular joint disorder, unspecified: Secondary | ICD-10-CM | POA: Diagnosis not present

## 2024-10-24 NOTE — Progress Notes (Signed)
 Dear Dr. Catherine, Here is my assessment for our mutual patient, Michelle Ochoa. Thank you for allowing me the opportunity to care for your patient. Please do not hesitate to contact me should you have any other questions. Sincerely, Chyrl Cohen PA-C  Otolaryngology Clinic Note Referring provider: Dr. Catherine HPI:  Michelle Ochoa is a 64 y.o. female kindly referred by Dr. Catherine   Discussed the use of AI scribe software for clinical note transcription with the patient, who gave verbal consent to proceed.  History of Present Illness    Michelle Ochoa is a 64 year old female with Eustachian tube dysfunction who presents with persistent, intermittent ear pressure and fullness.  She was last seen in the office on 06/25/2024 close a recap of that encounter.  The patient is a 64 year old female presenting today for evaluation of ear fullness.  The patient notes that for the last 1.5 years she has had intermittent sensation of pressure in her bilateral ears.  She notes that this does fluctuate from 1 year to the other.  She notes it has been intermittent but fairly consistent.  Denies any significant pain, she does note some clicking in her left ear predominantly at night.  She denies any history recurrent infections as an adult, she denies any recurrent infections as a kid but did have ear infection as a kid.  She notes that she saw her primary care provider who noted middle ear effusion on the left, she was started on Flonase and daily antihistamine.  She notes she has inconsistently used the [employer].  She denies any significant seasonal allergies.  No changes to her hearing.  She notes a history of intermittent vertigo.    Update 10/24/2024  Over the past year, she has experienced intermittent bilateral ear pressure and fullness, accompanied by clicking, popping, and occasional otalgia. Symptoms fluctuate in severity and are sometimes associated with a sensation of fluid in the ear, particularly following  water exposure such as washing her hair. She describes the pressure as unusual, with episodes of more significant discomfort, including one instance of notable pain localized near the ear and jaw.  She denies significant hearing loss, and reports that her recent audiologic evaluation did not identify any hearing problems. The previously frequent pulsatile tinnitus ('whooshing' noise) has resolved. She occasionally experiences headaches, most recently associated with right ear pressure. Mild clicking in the left ear occurred this morning but was not significant.  She intermittently uses Zyrtec, typically at night, and notes partial improvement with antihistamines. She does not use saline irrigation or intranasal steroids regularly as recommended at her last office visit, as she was uncertain of their benefit given the absence of middle ear effusion on prior evaluations.  She reports a severe upper respiratory infection in February of the previous year, which she believes may have exacerbated her symptoms, though ear pressure predated this illness. She also describes intermittent pain and sensitivity in the upper right teeth, localized near the jaw and ear, and notes significant stress, which she suspects may contribute to jaw symptoms. She denies acute changes in vision or significant hearing loss.           Independent Review of Additional Tests or Records:  Audiological evaluation 09/20/2024  Otoscopy: Right ear: Clear external ear canal and notable landmarks visualized on the tympanic membrane. Left ear:  Clear external ear canal and notable landmarks visualized on the tympanic membrane.   Tympanometry: Right ear: Type As - Normal external ear canal volume with normal  middle ear pressure and low tympanic membrane compliance. Findings are consistent with reduced eardrum mobility. Left ear: Type As - Normal external ear canal volume with normal middle ear pressure and low tympanic membrane  compliance. Findings are consistent with reduced eardrum mobility.     Hearing Evaluation The hearing test results were completed under headphones and results are deemed to be of good reliability. Test technique:  conventional     Pure tone Audiometry: Right ear- Normal hearing thresholds across 250 Hz-8000 Hz    Left ear-  Normal hearing thresholds across 250 Hz - 8000 Hz.   Speech Audiometry: Right ear- Speech Reception Threshold (SRT) was obtained at 5 dBHL. Left ear-Speech Reception Threshold (SRT) was obtained at 5 dBHL.   Word Recognition Score Tested using NU-6 (recorded) Right ear: 100% was obtained at a presentation level of 50 dBHL with contralateral masking which is deemed as  excellent. Left ear: 96% was obtained at a presentation level of 50 dBHL with contralateral masking which is deemed as  excellent.   Impression: There is not a significant difference in pure-tone thresholds between ears. There is not a significant difference in the word recognition score in between ears.    Recommendations: Follow up with ENT as needed. Repeat audiogram with concerns of tinnitus or hearing changes, or as per MD.  PMH/Meds/All/SocHx/FamHx/ROS:   Past Medical History:  Diagnosis Date   Anemia    with 1st childbirth   Arthritis    Basal cell carcinoma    Chicken pox    Eustachian tube dysfunction, left 05/09/2024   Headache    History of shingles 07/2020   Insomnia    PONV (postoperative nausea and vomiting)    Tick bite 05/25/2016   Toe fracture, right 2012   rt great toe   UTI (lower urinary tract infection)      Past Surgical History:  Procedure Laterality Date   APPENDECTOMY  2005   CESAREAN SECTION  [id number]   LAPAROSCOPIC ABDOMINAL EXPLORATION      Family History  Problem Relation Age of Onset   Breast cancer Mother        14   Dementia Mother    Prostate cancer Father    Colon polyps Father    Diabetes Paternal Uncle    Heart disease Paternal  Uncle    Early death Paternal Uncle    Depression Maternal Grandmother    Diabetes Maternal Grandfather    Diabetes Paternal Grandfather    Colon cancer Neg Hx    Rectal cancer Neg Hx    Stomach cancer Neg Hx      Social Connections: Not on file     Current Medications[1]   Physical Exam:   BP 102/65   Pulse (!) 56   Temp 98.4 F (36.9 C)   SpO2 95%   Pertinent Findings  CN II-XII grossly intact Bilateral EAC clear and TM intact with well pneumatized middle ear spaces Anterior rhinoscopy: Septum midline; bilateral inferior turbinates with no hypertrophy No lesions of oral cavity/oropharynx; dentition within normal limits No obviously neck masses/lymphadenopathy/thyromegaly No respiratory distress or stridor   Seprately Identifiable Procedures:  None  Impression & Plans:  Banessa Mao is a 64 y.o. female with the following   Assessment and Plan    Eustachian tube dysfunction Chronic, intermittent dysfunction with mild symptoms. Audiogram and otoscopy showed no severe dysfunction or effusion.  Hearing preserved.  She has not attempted the recommendations of saline irrigation antihistamines and Flonase. - Use intranasal  corticosteroid (Fluticasone), saline nasal irrigation, and antihistamines for three months. - Discontinue therapies if no improvement after three months. - Educated on chronic nature of symptoms and lack of benefit from imaging or surgery. - Return if symptoms worsen or new symptoms develop.  Temporomandibular joint disorder Intermittent mandibular pain, pressure, and dental sensitivity. TMJ disorder considered due to referred pain and aural symptoms. Stress and possible bruxism may contribute. - Recommend dental evaluation for TMJ assessment and possible occlusal guard. - Educated on TMJ dysfunction's link to otologic symptoms, stress, and bruxism.       - f/u PRN   Thank you for allowing me the opportunity to care for your patient. Please do not  hesitate to contact me should you have any other questions.  Sincerely, Chyrl Cohen PA-C [facility] Phone: [phone] Fax: [phone]  10/24/2024, 3:15 PM        [1]  Current Outpatient Medications:    buPROPion  (WELLBUTRIN  XL) 150 MG 24 hr tablet, Take 1 tablet (150 mg total) by mouth daily., Disp: 90 tablet, Rfl: 1   diazepam  (VALIUM ) 5 MG tablet, Take 0.5-1 tablets (2.5-5 mg total) by mouth every 12 (twelve) hours as needed for anxiety., Disp: 60 tablet, Rfl: 1   estradiol (VIVELLE-DOT) 0.05 MG/24HR patch, 1 patch 2 (two) times a week., Disp: , Rfl:    meclizine  (ANTIVERT ) 25 MG tablet, Take 0.5-1 tablets (12.5-25 mg total) by mouth 3 (three) times daily as needed for dizziness., Disp: 90 tablet, Rfl: 0   progesterone (PROMETRIUM) 100 MG capsule, progesterone micronized 100 mg capsule  TAKE 1 CAPSULE BY MOUTH EVERY DAY, Disp: , Rfl:    triamcinolone  cream (KENALOG ) 0.1 %, Apply 1 Application topically 2 (two) times daily., Disp: 45 g, Rfl: 0   Vitamin D , Cholecalciferol, 25 MCG (1000 UT) CAPS, Take 1 capsule by mouth daily., Disp: , Rfl:

## 2024-10-30 ENCOUNTER — Telehealth: Payer: Self-pay | Admitting: Family Medicine

## 2024-10-30 MED ORDER — SEMAGLUTIDE-WEIGHT MANAGEMENT 1.5 MG PO TABS: 1.5000 mg | ORAL_TABLET | Freq: Every day | ORAL | 0 refills | Status: AC

## 2024-10-30 MED ORDER — SEMAGLUTIDE-WEIGHT MANAGEMENT 4 MG PO TABS: 4.0000 mg | ORAL_TABLET | Freq: Every day | ORAL | 0 refills | Status: AC

## 2024-10-30 NOTE — Telephone Encounter (Signed)
 Please call patient I have printed two Wegovy  tablets prescriptions for her.  She will need to pick these up at our office and take the prescriptions to the pharmacy.  Since this is so  new there is no assigned transfer number yet in the order for it to be electronically submitted to the pharmacy. She will start with Wegovy  tablet 1.5 mg daily.  This medication has to be taken on an empty stomach in the morning with a sip of water, no more than 4 ounces of water.  Wait 30 minutes and then she can eat/drink.  She will take this tablet for 30 days, and then increase to the next dose which is Wegovy  4 mg tablet.  Please have her follow-up in 6 weeks with this provider after starting medication.  She may want to call around to pharmacies to ensure they have this in stock, the [facility] pharmacy may be her best option if she is finding it difficult to locate
